# Patient Record
Sex: Male | Born: 1965 | Race: Black or African American | Hispanic: No | Smoking: Current every day smoker
Health system: Southern US, Community
[De-identification: ages and names within clinical notes are randomized; demographics above are authoritative.]

## PROBLEM LIST (undated history)

## (undated) DIAGNOSIS — I2510 Atherosclerotic heart disease of native coronary artery without angina pectoris: Secondary | ICD-10-CM

## (undated) DIAGNOSIS — E1110 Type 2 diabetes mellitus with ketoacidosis without coma: Secondary | ICD-10-CM

## (undated) DIAGNOSIS — N529 Male erectile dysfunction, unspecified: Secondary | ICD-10-CM

## (undated) DIAGNOSIS — I2109 ST elevation (STEMI) myocardial infarction involving other coronary artery of anterior wall: Secondary | ICD-10-CM

## (undated) DIAGNOSIS — E119 Type 2 diabetes mellitus without complications: Secondary | ICD-10-CM

## (undated) DIAGNOSIS — G8929 Other chronic pain: Secondary | ICD-10-CM

## (undated) DIAGNOSIS — K219 Gastro-esophageal reflux disease without esophagitis: Secondary | ICD-10-CM

## (undated) DIAGNOSIS — E785 Hyperlipidemia, unspecified: Secondary | ICD-10-CM

## (undated) HISTORY — DX: Other chronic pain: G89.29

## (undated) HISTORY — DX: Male erectile dysfunction, unspecified: N52.9

## (undated) HISTORY — DX: Atherosclerotic heart disease of native coronary artery without angina pectoris: I25.10

## (undated) HISTORY — DX: ST elevation (STEMI) myocardial infarction involving other coronary artery of anterior wall: I21.09

## (undated) HISTORY — PX: CORONARY ANGIOPLASTY WITH STENT PLACEMENT: SHX49

## (undated) HISTORY — DX: Hyperlipidemia, unspecified: E78.5

## (undated) HISTORY — DX: Type 2 diabetes mellitus with ketoacidosis without coma: E11.10

## (undated) HISTORY — PX: COLONOSCOPY: SHX174

## (undated) HISTORY — DX: Gastro-esophageal reflux disease without esophagitis: K21.9

## (undated) HISTORY — DX: Type 2 diabetes mellitus without complications: E11.9

## (undated) HISTORY — PX: SHOULDER SURGERY: SHX246

## (undated) HISTORY — PX: LUMBAR FUSION: SHX111

## (undated) HISTORY — PX: HIP SURGERY: SHX245

---

## 2015-04-06 ENCOUNTER — Other Ambulatory Visit (HOSPITAL_COMMUNITY): Payer: Self-pay | Admitting: Orthopedic Surgery

## 2015-04-06 DIAGNOSIS — M545 Low back pain: Secondary | ICD-10-CM

## 2015-04-06 DIAGNOSIS — Z981 Arthrodesis status: Secondary | ICD-10-CM

## 2015-04-22 ENCOUNTER — Ambulatory Visit (HOSPITAL_COMMUNITY)
Admission: RE | Admit: 2015-04-22 | Discharge: 2015-04-22 | Disposition: A | Discharge status: Home / Self Care | Payer: Medicare Other | Source: Ambulatory Visit | Attending: Orthopedic Surgery | Admitting: Orthopedic Surgery

## 2015-04-22 ENCOUNTER — Other Ambulatory Visit (HOSPITAL_COMMUNITY): Payer: Self-pay | Admitting: Orthopedic Surgery

## 2015-04-22 ENCOUNTER — Encounter (HOSPITAL_COMMUNITY): Payer: Self-pay | Admitting: Radiology

## 2015-04-22 DIAGNOSIS — N289 Disorder of kidney and ureter, unspecified: Secondary | ICD-10-CM | POA: Insufficient documentation

## 2015-04-22 DIAGNOSIS — M4806 Spinal stenosis, lumbar region: Secondary | ICD-10-CM | POA: Diagnosis not present

## 2015-04-22 DIAGNOSIS — I700 Atherosclerosis of aorta: Secondary | ICD-10-CM | POA: Diagnosis not present

## 2015-04-22 DIAGNOSIS — M545 Low back pain: Secondary | ICD-10-CM

## 2015-04-22 DIAGNOSIS — Z981 Arthrodesis status: Secondary | ICD-10-CM

## 2015-05-14 ENCOUNTER — Encounter (HOSPITAL_COMMUNITY): Payer: Self-pay | Admitting: Emergency Medicine

## 2015-05-14 ENCOUNTER — Emergency Department (HOSPITAL_COMMUNITY)
Admission: EM | Admit: 2015-05-14 | Discharge: 2015-05-14 | Disposition: A | Discharge status: Home / Self Care | Payer: Medicare Other | Attending: Emergency Medicine | Admitting: Emergency Medicine

## 2015-05-14 DIAGNOSIS — F17200 Nicotine dependence, unspecified, uncomplicated: Secondary | ICD-10-CM | POA: Diagnosis not present

## 2015-05-14 DIAGNOSIS — G8929 Other chronic pain: Secondary | ICD-10-CM | POA: Diagnosis not present

## 2015-05-14 DIAGNOSIS — M545 Low back pain: Secondary | ICD-10-CM | POA: Insufficient documentation

## 2015-05-14 NOTE — ED Notes (Signed)
Pt sts lower back pain chronic in nature increased x 4 days; pt sts supposed to have sx at some point; pt denies new injury

## 2018-08-16 ENCOUNTER — Other Ambulatory Visit: Payer: Self-pay | Admitting: Cardiology

## 2018-08-16 ENCOUNTER — Ambulatory Visit (INDEPENDENT_AMBULATORY_CARE_PROVIDER_SITE_OTHER): Payer: Medicare HMO | Admitting: Cardiology

## 2018-08-16 ENCOUNTER — Encounter: Payer: Self-pay | Admitting: Cardiology

## 2018-08-16 ENCOUNTER — Other Ambulatory Visit: Payer: Self-pay

## 2018-08-16 VITALS — BP 143/82 | HR 77 | Temp 96.8°F | Ht 69.0 in | Wt 166.0 lb

## 2018-08-16 DIAGNOSIS — I25118 Atherosclerotic heart disease of native coronary artery with other forms of angina pectoris: Secondary | ICD-10-CM

## 2018-08-16 DIAGNOSIS — IMO0002 Reserved for concepts with insufficient information to code with codable children: Secondary | ICD-10-CM

## 2018-08-16 DIAGNOSIS — I10 Essential (primary) hypertension: Secondary | ICD-10-CM

## 2018-08-16 DIAGNOSIS — I2510 Atherosclerotic heart disease of native coronary artery without angina pectoris: Secondary | ICD-10-CM | POA: Insufficient documentation

## 2018-08-16 DIAGNOSIS — I2109 ST elevation (STEMI) myocardial infarction involving other coronary artery of anterior wall: Secondary | ICD-10-CM | POA: Insufficient documentation

## 2018-08-16 DIAGNOSIS — N529 Male erectile dysfunction, unspecified: Secondary | ICD-10-CM | POA: Insufficient documentation

## 2018-08-16 DIAGNOSIS — E1165 Type 2 diabetes mellitus with hyperglycemia: Secondary | ICD-10-CM

## 2018-08-16 DIAGNOSIS — Z794 Long term (current) use of insulin: Secondary | ICD-10-CM | POA: Diagnosis not present

## 2018-08-16 DIAGNOSIS — F17200 Nicotine dependence, unspecified, uncomplicated: Secondary | ICD-10-CM | POA: Diagnosis not present

## 2018-08-16 DIAGNOSIS — E1110 Type 2 diabetes mellitus with ketoacidosis without coma: Secondary | ICD-10-CM | POA: Insufficient documentation

## 2018-08-16 DIAGNOSIS — K219 Gastro-esophageal reflux disease without esophagitis: Secondary | ICD-10-CM | POA: Insufficient documentation

## 2018-08-16 DIAGNOSIS — R011 Cardiac murmur, unspecified: Secondary | ICD-10-CM | POA: Diagnosis not present

## 2018-08-16 MED ORDER — AMLODIPINE BESYLATE 5 MG PO TABS: 5.0000 mg | ORAL_TABLET | Freq: Every day | ORAL | 1 refills | Status: DC

## 2018-08-16 MED ORDER — NICOTINE 21 MG/24HR TD PT24: 21.0000 mg | MEDICATED_PATCH | Freq: Every day | TRANSDERMAL | 0 refills | Status: DC

## 2018-08-16 MED ORDER — NITROGLYCERIN 0.4 MG SL SUBL: 0.4000 mg | SUBLINGUAL_TABLET | SUBLINGUAL | 3 refills | Status: AC | PRN

## 2018-08-16 MED ORDER — NICOTINE 14 MG/24HR TD PT24: 14.0000 mg | MEDICATED_PATCH | Freq: Every day | TRANSDERMAL | 0 refills | Status: DC

## 2018-08-16 NOTE — Telephone Encounter (Signed)
Please fill

## 2018-08-16 NOTE — Progress Notes (Signed)
Primary Physician:  Everardo Beals, NP   Patient ID: Javier Brown, male    DOB: 1966/03/04, 53 y.o.   MRN: [id number]  Subjective:    Chief Complaint  Patient presents with  . Coronary Artery Disease    chest pain on and off, needs nitro refilled  . New Patient (Initial Visit)    HPI: Javier Brown  is a 53 y.o. male  with history of CAD s/p stent to LAD in 2004 and again in 2010, hypertension, hyperlipidemia, GERD, tobacco use, and type 2 diabetes referred to [place] by Jinny Sanders, NP to establish care.   Patient continues to be on Prasugrel. He was previously followed by Cardiology in [place], [place]; however, has not seen them for the last several years. He moved back to [place] 1 year ago.  Prior to both MI's, he had indigestion symptoms. He reports that for the last few months he has had exertional heartburn/ burning sensation in his chest that resolves with resting. Does not occur every time with exertion. He denies any shortness of breath, leg edema, palpitations, leg swelling, PND, orthopnea, or symptoms suggestive of claudication or TIA.    States that diabetes is uncontrolled, unsure of recent HgbA1c. Reports that hypertension and hyperlipidemia are well controlled. States that he is fairly active. He does not work due to disability from chronic back pain.  He does smoke 1 pack per day. Occasional alcohol use. No drug use.   Past Medical History:  Diagnosis Date  . Acute anterior wall MI ([place])   . CAD (coronary artery disease)   . Chronic back pain   . Diabetes mellitus without complication ([place])   . DKA (diabetic ketoacidoses) ([place])   . ED (erectile dysfunction)   . GERD (gastroesophageal reflux disease)   . Hyperlipidemia     Past Surgical History:  Procedure Laterality Date  . CORONARY ANGIOPLASTY WITH STENT PLACEMENT    . HIP SURGERY    . LUMBAR FUSION    . SHOULDER SURGERY      Social History   Socioeconomic History  . Marital status: Divorced   Spouse name: Not on file  . Number of children: 1  . Years of education: Not on file  . Highest education level: Not on file  Occupational History  . Not on file  Social Needs  . Financial resource strain: Not on file  . Food insecurity:    Worry: Not on file    Inability: Not on file  . Transportation needs:    Medical: Not on file    Non-medical: Not on file  Tobacco Use  . Smoking status: Current Every Day Smoker    Packs/day: 1.00    Types: Cigarettes  . Smokeless tobacco: Never Used  Substance and Sexual Activity  . Alcohol use: Yes    Alcohol/week: 4.0 standard drinks    Types: 4 Cans of beer per week    Comment: per week  . Drug use: No  . Sexual activity: Not on file  Lifestyle  . Physical activity:    Days per week: Not on file    Minutes per session: Not on file  . Stress: Not on file  Relationships  . Social connections:    Talks on phone: Not on file    Gets together: Not on file    Attends religious service: Not on file    Active member of club or organization: Not on file    Attends meetings of clubs or organizations: Not  on file    Relationship status: Not on file  . Intimate partner violence:    Fear of current or ex partner: Not on file    Emotionally abused: Not on file    Physically abused: Not on file    Forced sexual activity: Not on file  Other Topics Concern  . Not on file  Social History Narrative  . Not on file    Review of Systems  Constitution: Negative for decreased appetite, malaise/fatigue, weight gain and weight loss.  Eyes: Negative for visual disturbance.  Cardiovascular: Negative for chest pain, claudication, dyspnea on exertion, leg swelling, orthopnea, palpitations and syncope.  Respiratory: Negative for hemoptysis and wheezing.   Endocrine: Negative for cold intolerance and heat intolerance.  Hematologic/Lymphatic: Does not bruise/bleed easily.  Skin: Negative for nail changes.  Musculoskeletal: Positive for back pain.  Negative for muscle weakness and myalgias.  Gastrointestinal: Positive for heartburn (exertional). Negative for abdominal pain, change in bowel habit, nausea and vomiting.  Neurological: Negative for difficulty with concentration, dizziness, focal weakness and headaches.  Psychiatric/Behavioral: Negative for altered mental status and suicidal ideas.  All other systems reviewed and are negative.     Objective:  Blood pressure (!) 143/82, pulse 77, temperature (!) 96.8 F (36 C), height 5' 9"  (1.753 m), weight 166 lb (75.3 kg), SpO2 97 %. Body mass index is 24.51 kg/m.    Physical Exam  Constitutional: He is oriented to person, place, and time. Vital signs are normal. He appears well-developed and well-nourished.  HENT:  Head: Normocephalic and atraumatic.  Neck: Normal range of motion.  Cardiovascular: Normal rate, regular rhythm, normal heart sounds and intact distal pulses.  Ejection click  Pulmonary/Chest: Effort normal and breath sounds normal. No accessory muscle usage. No respiratory distress.  Abdominal: Soft. Bowel sounds are normal.  Musculoskeletal: Normal range of motion.  Neurological: He is alert and oriented to person, place, and time.  Skin: Skin is warm and dry.  Vitals reviewed.  Radiology: No results found.  Laboratory examination:   PCP 07/06/2017: Na 132, K 4.4, Creatinine 0.82, Glucose 147, eGFR >90, CMP otherwise normal. RBC 4.4, CBC otherwise normal.  No flowsheet data found. No flowsheet data found. Lipid Panel  No results found for: CHOL, TRIG, HDL, CHOLHDL, VLDL, LDLCALC, LDLDIRECT HEMOGLOBIN A1C No results found for: HGBA1C, MPG TSH No results for input(s): TSH in the last 8760 hours.  PRN Meds:. There are no discontinued medications. Current Meds  Medication Sig  . aspirin EC 81 MG tablet Take 1 tablet by mouth daily.  . insulin aspart (NOVOLOG FLEXPEN) 100 UNIT/ML FlexPen Inject 8 Units as directed 3 (three) times daily.  Marland Kitchen lisinopril  (ZESTRIL) 10 MG tablet Take 1 tablet by mouth daily.  . metoprolol tartrate (LOPRESSOR) 25 MG tablet Take 1 tablet by mouth 2 (two) times a day.  Marland Kitchen omeprazole (PRILOSEC) 20 MG capsule Take 20 mg by mouth daily.  . prasugrel (EFFIENT) 10 MG TABS tablet Take 10 mg by mouth daily.  . rosuvastatin (CRESTOR) 10 MG tablet Take 1 tablet by mouth daily.    Cardiac Studies:   Exercise sestamibi stress test 02/19/2009: 1. No convincing scintigraphic evidence of ischemia or transmural  myocardial infarction.  Stress EKG portion not dictated. 2. Abnormal resting left ventricular wall motion showing generalized  hypokinesis and ejection fraction of 37 %. (Normal is 50% and greater).  Assessment:   Coronary artery disease of native artery of native heart with stable angina pectoris ([place]) - Plan: EKG  12-Lead, PCV ECHOCARDIOGRAM COMPLETE, PCV MYOCARDIAL PERFUSION WO LEXISCAN  Essential hypertension  Uncontrolled type 2 diabetes mellitus, with long-term current use of insulin (H[facility])  Tobacco use disorder  Systolic click  EKG 89/37/3428: Normal sinus rhythm at 69 bpm, normal axis, anterior infarct old. T wave inversion in inferolateral leads, cannot exclude ischemia.   Recommendations:   Patient with CAD with history of anterior wall MI in 2004 and 2010, hypertension, hyperlipidemia, type 2 diabetes, tobacco use disorder, referred to [place] to establish care.  Patients symptoms of exertional indigestion are suggestive of angina. I have sent him in a prescription of nitroglycerin. Blood pressure is also elevated, will start amlodipine both for hypertension and angina. Will schedule for exercise nuclear stress testing in the next few weeks for further evaluation. Continues to be on Effient and ASA, will continue for now. No clinical evidence of heart failure. On physical exam, he does have systolic click present. Will obtain echocardiogram for further evaluation.   Patient is on Crestor for hyperlipidemia,  will continue the same. I have stressed the importance of smoking cessation. Patient is willing to quit smoking. We have discussed treatment options, he wishes to try nicotine patches for now. Will send in the nicotine patches step packet with 21 mg for 4 weeks and 14 mg for the next 4 weeks. Will plan to send in 7 mg after completion of 14 mg packet at his next office visit. Encouraged him to contact us sooner if needed. We will see him back after the test for further recommendations.   *I have discussed this case with Dr. Einar Gip and he personally examined the patient and participated in formulating the plan.*   Miquel Dunn, MSN, APRN, FNP-C [facility]. [place] Office: [phone] Fax: [phone]

## 2018-08-18 ENCOUNTER — Other Ambulatory Visit: Payer: Self-pay | Admitting: Cardiology

## 2018-08-19 NOTE — Telephone Encounter (Signed)
Please fill

## 2018-09-09 ENCOUNTER — Other Ambulatory Visit: Payer: Self-pay

## 2018-09-09 ENCOUNTER — Other Ambulatory Visit: Payer: Medicare HMO

## 2018-09-09 ENCOUNTER — Ambulatory Visit (INDEPENDENT_AMBULATORY_CARE_PROVIDER_SITE_OTHER): Payer: Medicare HMO

## 2018-09-09 DIAGNOSIS — I25118 Atherosclerotic heart disease of native coronary artery with other forms of angina pectoris: Secondary | ICD-10-CM | POA: Diagnosis not present

## 2018-09-10 NOTE — Progress Notes (Signed)
Pt aware.

## 2018-09-10 NOTE — Progress Notes (Signed)
Let patient know that stress test was considered low risk study, we will further discuss at his next follow up. If he is still having symptoms, we may need to further evaluate.

## 2018-09-18 ENCOUNTER — Other Ambulatory Visit: Payer: Self-pay

## 2018-09-18 ENCOUNTER — Ambulatory Visit (INDEPENDENT_AMBULATORY_CARE_PROVIDER_SITE_OTHER): Payer: Medicare HMO

## 2018-09-18 DIAGNOSIS — I25118 Atherosclerotic heart disease of native coronary artery with other forms of angina pectoris: Secondary | ICD-10-CM | POA: Diagnosis not present

## 2018-09-27 ENCOUNTER — Encounter: Payer: Self-pay | Admitting: Cardiology

## 2018-09-27 ENCOUNTER — Ambulatory Visit (INDEPENDENT_AMBULATORY_CARE_PROVIDER_SITE_OTHER): Payer: Medicare HMO | Admitting: Cardiology

## 2018-09-27 ENCOUNTER — Other Ambulatory Visit: Payer: Self-pay

## 2018-09-27 VITALS — Ht 69.0 in | Wt 168.0 lb

## 2018-09-27 DIAGNOSIS — F17200 Nicotine dependence, unspecified, uncomplicated: Secondary | ICD-10-CM

## 2018-09-27 DIAGNOSIS — Z794 Long term (current) use of insulin: Secondary | ICD-10-CM | POA: Diagnosis not present

## 2018-09-27 DIAGNOSIS — E1165 Type 2 diabetes mellitus with hyperglycemia: Secondary | ICD-10-CM

## 2018-09-27 DIAGNOSIS — I10 Essential (primary) hypertension: Secondary | ICD-10-CM

## 2018-09-27 DIAGNOSIS — I2510 Atherosclerotic heart disease of native coronary artery without angina pectoris: Secondary | ICD-10-CM | POA: Diagnosis not present

## 2018-09-27 DIAGNOSIS — IMO0002 Reserved for concepts with insufficient information to code with codable children: Secondary | ICD-10-CM

## 2018-09-27 NOTE — Progress Notes (Signed)
Primary Physician:  Everardo Beals, NP   Patient ID: Javier Brown, male    DOB: September 05, 1965, 53 y.o.   MRN: [id number]  Subjective:    Chief Complaint  Patient presents with  . Coronary Artery Disease  . Follow-up   This visit type was conducted due to national recommendations for restrictions regarding the COVID-19 Pandemic (e.g. social distancing).  This format is felt to be most appropriate for this patient at this time.  All issues noted in this document were discussed and addressed.  No physical exam was performed (except for noted visual exam findings with Telehealth visits).  The patient has consented to conduct a Telehealth visit and understands insurance will be billed.   I discussed the limitations of evaluation and management by telemedicine and the availability of in person appointments. The patient expressed understanding and agreed to proceed.  Virtual Visit via Video Note is as below  I connected with Javier Brown, on 09/27/18 at 1015 by telephone and verified that I am speaking with the correct person using two identifiers. Unable to perform video visit as patient did not have equipment.    I have discussed with the patient regarding the safety during COVID Pandemic and steps and precautions including social distancing with the patient.    HPI: Javier Brown  is a 53 y.o. male  with history of CAD s/p stent to LAD in 2004 and again in 2010, hypertension, hyperlipidemia, GERD, tobacco use, and type 2 diabetes, recently evaluated by [place] for symptoms of angina in the form of indegestion.  He underwent nuclear stress test and echocardiogram and now presents for follow up. Started on amlodipine at his last visit.   Patient continues to be on Prasugrel. He was previously followed by Cardiology in [place], [place].   Prior to both MI's, he had indigestion symptoms and over the last few months had had exertional indigestion that improved with resting. Since being on amlodipine, he  states that he has not had any further episodes. Feeling well.   He denies any shortness of breath, leg edema, palpitations, leg swelling, PND, orthopnea, or symptoms suggestive of claudication or TIA.    States that diabetes is better controlled, he is to have follow up and labs with PCP in the next few weeks. Reports that hypertension and hyperlipidemia are well controlled. States that he is fairly active. He does not work due to disability from chronic back pain.  He has now cut back to 1/2 pack per day with nicotine patches. Occasional alcohol use. No drug use.   Past Medical History:  Diagnosis Date  . Acute anterior wall MI ([place])   . CAD (coronary artery disease)   . Chronic back pain   . Diabetes mellitus without complication ([place])   . DKA (diabetic ketoacidoses) ([place])   . ED (erectile dysfunction)   . GERD (gastroesophageal reflux disease)   . Hyperlipidemia     Past Surgical History:  Procedure Laterality Date  . CORONARY ANGIOPLASTY WITH STENT PLACEMENT    . HIP SURGERY    . LUMBAR FUSION    . SHOULDER SURGERY      Social History   Socioeconomic History  . Marital status: Divorced    Spouse name: Not on file  . Number of children: 1  . Years of education: Not on file  . Highest education level: Not on file  Occupational History  . Not on file  Social Needs  . Financial resource strain: Not on file  .  Food insecurity    Worry: Not on file    Inability: Not on file  . Transportation needs    Medical: Not on file    Non-medical: Not on file  Tobacco Use  . Smoking status: Current Every Day Smoker    Packs/day: 1.00    Types: Cigarettes  . Smokeless tobacco: Never Used  Substance and Sexual Activity  . Alcohol use: Yes    Alcohol/week: 4.0 standard drinks    Types: 4 Cans of beer per week    Comment: per week  . Drug use: No  . Sexual activity: Not on file  Lifestyle  . Physical activity    Days per week: Not on file    Minutes per session: Not on  file  . Stress: Not on file  Relationships  . Social Herbalist on phone: Not on file    Gets together: Not on file    Attends religious service: Not on file    Active member of club or organization: Not on file    Attends meetings of clubs or organizations: Not on file    Relationship status: Not on file  . Intimate partner violence    Fear of current or ex partner: Not on file    Emotionally abused: Not on file    Physically abused: Not on file    Forced sexual activity: Not on file  Other Topics Concern  . Not on file  Social History Narrative  . Not on file    Review of Systems  Constitution: Negative for decreased appetite, malaise/fatigue, weight gain and weight loss.  Eyes: Negative for visual disturbance.  Cardiovascular: Negative for chest pain, claudication, dyspnea on exertion, leg swelling, orthopnea, palpitations and syncope.  Respiratory: Negative for hemoptysis and wheezing.   Endocrine: Negative for cold intolerance and heat intolerance.  Hematologic/Lymphatic: Does not bruise/bleed easily.  Skin: Negative for nail changes.  Musculoskeletal: Positive for back pain. Negative for muscle weakness and myalgias.  Gastrointestinal: Positive for heartburn (exertional). Negative for abdominal pain, change in bowel habit, nausea and vomiting.  Neurological: Negative for difficulty with concentration, dizziness, focal weakness and headaches.  Psychiatric/Behavioral: Negative for altered mental status and suicidal ideas.  All other systems reviewed and are negative.     Objective:  Height 5' 9"  (1.753 m), weight 168 lb (76.2 kg). Body mass index is 24.81 kg/m.   Physical exam not performed or limited due to virtual visit.    Please see exam details from prior visit is as below.    Physical Exam  Constitutional: He is oriented to person, place, and time. Vital signs are normal. He appears well-developed and well-nourished.  HENT:  Head: Normocephalic and  atraumatic.  Neck: Normal range of motion.  Cardiovascular: Normal rate, regular rhythm, normal heart sounds and intact distal pulses.  Ejection click  Pulmonary/Chest: Effort normal and breath sounds normal. No accessory muscle usage. No respiratory distress.  Abdominal: Soft. Bowel sounds are normal.  Musculoskeletal: Normal range of motion.  Neurological: He is alert and oriented to person, place, and time.  Skin: Skin is warm and dry.  Vitals reviewed.  Radiology: No results found.  Laboratory examination:   PCP 07/06/2017: Na 132, K 4.4, Creatinine 0.82, Glucose 147, eGFR >90, CMP otherwise normal. RBC 4.4, CBC otherwise normal.  No flowsheet data found. No flowsheet data found. Lipid Panel  No results found for: CHOL, TRIG, HDL, CHOLHDL, VLDL, LDLCALC, LDLDIRECT HEMOGLOBIN A1C No results found for: HGBA1C,  MPG TSH No results for input(s): TSH in the last 8760 hours.  PRN Meds:. Medications Discontinued During This Encounter  Medication Reason  . nicotine (NICODERM CQ - DOSED IN MG/24 HOURS) 21 mg/24hr patch Error   Current Meds  Medication Sig  . amLODipine (NORVASC) 5 MG tablet TAKE 1 TABLET(5 MG) BY MOUTH DAILY  . aspirin EC 81 MG tablet Take 1 tablet by mouth daily.  . insulin aspart (NOVOLOG FLEXPEN) 100 UNIT/ML FlexPen Inject 8 Units as directed 3 (three) times daily.  Marland Kitchen lisinopril (ZESTRIL) 10 MG tablet Take 1 tablet by mouth daily.  . metoprolol tartrate (LOPRESSOR) 25 MG tablet Take 1 tablet by mouth 2 (two) times a day.  . nicotine (NICODERM CQ - DOSED IN MG/24 HOURS) 14 mg/24hr patch Place 1 patch (14 mg total) onto the skin daily.  . nitroGLYCERIN (NITROSTAT) 0.4 MG SL tablet Place 1 tablet (0.4 mg total) under the tongue every 5 (five) minutes as needed for chest pain.  Marland Kitchen omeprazole (PRILOSEC) 20 MG capsule Take 20 mg by mouth daily.  . prasugrel (EFFIENT) 10 MG TABS tablet Take 10 mg by mouth daily.  . rosuvastatin (CRESTOR) 10 MG tablet Take 1 tablet by  mouth daily.    Cardiac Studies:   Echocardiogram 09/18/2018: Normal LV systolic function with EF 50-55%. Left ventricle cavity is normal in size. Mild concentric hypertrophy of the left ventricle. Normal global wall motion. Normal diastolic filling pattern.  No significant valvular abnormality. Normal right atrial pressure.   Lexiscan Myoview Stress Test 09/09/2018: Resting EKG demonstrated normal sinus rhythm.  Nonspecific T-wave abnormality.  Stress EKG is nondiagnostic. Perfusion imaging study demonstrates mild apical thinning but no demonstrable ischemia or scar.  LV systolic function was 16% without regional wall motion abnormality. This is a low risk study.  Assessment:     ICD-10-CM   1. Coronary artery disease involving native coronary artery of native heart without angina pectoris  I25.10   2. Essential hypertension  I10   3. Uncontrolled type 2 diabetes mellitus, with long-term current use of insulin (HCC)  E11.65    Z79.4   4. Tobacco use disorder  F17.200      EKG 08/16/2018: Normal sinus rhythm at 69 bpm, normal axis, anterior infarct old. T wave inversion in inferolateral leads, cannot exclude ischemia  Recommendations:   Patient has had improvement in angina symptoms with addition of amlodipine.  I discussed recently obtained nuclear stress test with the patient, considered low risk study.  I suspect his angina was related to small vessel disease, would recommend continued aggressive medical management unless he again has worsening symptoms.  I will continue with Effient and aspirin therapy for now, but may consider discontinuing Effient at his next office visit.    I have also discussed recently obtain echocardiogram, normal LVEF.  No valvular abnormalities.  He will continue to work with his PCP for management of diabetes that he states is much better controlled.  He will have lipids reevaluated soon with PCP as well.  He did not have a way to check his blood  pressure today, will have this also evaluated at PCP.    I have congratulated him on being able to cut back on tobacco use.  Encouraged him to continue to work on this with nicotine patches and stress importance of completely quitting smoking.  I'll see him back in 3 months for follow-up.  Miquel Dunn, MSN, APRN, FNP-C [facility]. [place] Office: [phone] Fax: [phone]

## 2018-12-02 ENCOUNTER — Other Ambulatory Visit: Payer: Self-pay | Admitting: Cardiology

## 2019-01-01 ENCOUNTER — Ambulatory Visit: Payer: Medicare HMO | Admitting: Cardiology

## 2019-01-09 ENCOUNTER — Encounter: Payer: Self-pay | Admitting: Cardiology

## 2019-01-09 ENCOUNTER — Other Ambulatory Visit: Payer: Self-pay

## 2019-01-09 ENCOUNTER — Ambulatory Visit (INDEPENDENT_AMBULATORY_CARE_PROVIDER_SITE_OTHER): Payer: Medicare HMO | Admitting: Cardiology

## 2019-01-09 VITALS — BP 142/83 | HR 79 | Temp 96.5°F | Ht 69.0 in | Wt 168.0 lb

## 2019-01-09 DIAGNOSIS — Z794 Long term (current) use of insulin: Secondary | ICD-10-CM | POA: Diagnosis not present

## 2019-01-09 DIAGNOSIS — E1165 Type 2 diabetes mellitus with hyperglycemia: Secondary | ICD-10-CM | POA: Diagnosis not present

## 2019-01-09 DIAGNOSIS — I10 Essential (primary) hypertension: Secondary | ICD-10-CM | POA: Diagnosis not present

## 2019-01-09 DIAGNOSIS — IMO0002 Reserved for concepts with insufficient information to code with codable children: Secondary | ICD-10-CM

## 2019-01-09 DIAGNOSIS — N529 Male erectile dysfunction, unspecified: Secondary | ICD-10-CM | POA: Diagnosis not present

## 2019-01-09 DIAGNOSIS — I2510 Atherosclerotic heart disease of native coronary artery without angina pectoris: Secondary | ICD-10-CM | POA: Diagnosis not present

## 2019-01-09 DIAGNOSIS — F17200 Nicotine dependence, unspecified, uncomplicated: Secondary | ICD-10-CM

## 2019-01-09 MED ORDER — SILDENAFIL CITRATE 25 MG PO TABS: 25.0000 mg | ORAL_TABLET | Freq: Every day | ORAL | 1 refills | Status: DC | PRN

## 2019-01-09 NOTE — Progress Notes (Signed)
Primary Physician:  Everardo Beals, NP   Patient ID: Javier Brown, male    DOB: 12/15/1965, 53 y.o.   MRN: [id number]  Subjective:    Chief Complaint  Patient presents with  . Coronary Artery Disease  . Chest Pain  . Hypertension  . Follow-up    3 month    HPI: Javier Brown  is a 53 y.o. male  with history of CAD s/p stent to LAD in 2004 and again in 2010, hypertension, hyperlipidemia, GERD, tobacco use, and type 2 diabetes, recently evaluated by [place] for symptoms of angina in the form of indegestion.  He underwent nuclear stress test Lexiscan nuclear stress test on 09/09/2018 that was without perfusion abnormalities and was considered low risk study.  Echocardiogram also performed at that time revealed normal LVEF.  Patient had improvement with indigestion symptoms that were thought to be angina equivalent with addition of amlodipine.  Patient continues to be on Prasugrel. He was previously followed by Cardiology in [place], [place].   He denies any shortness of breath, leg edema, palpitations, leg swelling, PND, orthopnea, or symptoms suggestive of claudication or TIA. He is walking 4 times a day and tolerates this well without any chest pain.   States that diabetes is better controlled, he states he needs to follow up with his PCP. Reports that hypertension and hyperlipidemia are well controlled. States that he is fairly active. He does not work due to disability from chronic back pain.  He has now cut back to less than 1/2 pack a day as he is slowing weaning himself off. Occasional alcohol use. No drug use.   Past Medical History:  Diagnosis Date  . Acute anterior wall MI ([place])   . CAD (coronary artery disease)   . Chronic back pain   . Diabetes mellitus without complication ([place])   . DKA (diabetic ketoacidoses) ([place])   . ED (erectile dysfunction)   . GERD (gastroesophageal reflux disease)   . Hyperlipidemia     Past Surgical History:  Procedure Laterality Date  .  CORONARY ANGIOPLASTY WITH STENT PLACEMENT    . HIP SURGERY    . LUMBAR FUSION    . SHOULDER SURGERY      Social History   Socioeconomic History  . Marital status: Divorced    Spouse name: Not on file  . Number of children: 1  . Years of education: Not on file  . Highest education level: Not on file  Occupational History  . Not on file  Social Needs  . Financial resource strain: Not on file  . Food insecurity    Worry: Not on file    Inability: Not on file  . Transportation needs    Medical: Not on file    Non-medical: Not on file  Tobacco Use  . Smoking status: Current Every Day Smoker    Packs/day: 0.50    Types: Cigarettes  . Smokeless tobacco: Never Used  Substance and Sexual Activity  . Alcohol use: Yes    Alcohol/week: 4.0 standard drinks    Types: 4 Cans of beer per week    Comment: per week  . Drug use: No  . Sexual activity: Not on file  Lifestyle  . Physical activity    Days per week: Not on file    Minutes per session: Not on file  . Stress: Not on file  Relationships  . Social Herbalist on phone: Not on file    Gets together: Not on  file    Attends religious service: Not on file    Active member of club or organization: Not on file    Attends meetings of clubs or organizations: Not on file    Relationship status: Not on file  . Intimate partner violence    Fear of current or ex partner: Not on file    Emotionally abused: Not on file    Physically abused: Not on file    Forced sexual activity: Not on file  Other Topics Concern  . Not on file  Social History Narrative  . Not on file    Review of Systems  Constitution: Negative for decreased appetite, malaise/fatigue, weight gain and weight loss.  Eyes: Negative for visual disturbance.  Cardiovascular: Negative for chest pain, claudication, dyspnea on exertion, leg swelling, orthopnea, palpitations and syncope.  Respiratory: Negative for hemoptysis and wheezing.   Endocrine: Negative  for cold intolerance and heat intolerance.  Hematologic/Lymphatic: Does not bruise/bleed easily.  Skin: Negative for nail changes.  Musculoskeletal: Positive for back pain. Negative for muscle weakness and myalgias.  Gastrointestinal: Positive for heartburn (exertional). Negative for abdominal pain, change in bowel habit, nausea and vomiting.  Neurological: Negative for difficulty with concentration, dizziness, focal weakness and headaches.  Psychiatric/Behavioral: Negative for altered mental status and suicidal ideas.  All other systems reviewed and are negative.     Objective:  Blood pressure (!) 142/83, pulse 79, temperature (!) 96.5 F (35.8 C), height 5' 9"  (1.753 m), weight 168 lb (76.2 kg), SpO2 98 %. Body mass index is 24.81 kg/m.     Physical Exam  Constitutional: He is oriented to person, place, and time. Vital signs are normal. He appears well-developed and well-nourished.  HENT:  Head: Normocephalic and atraumatic.  Neck: Normal range of motion.  Cardiovascular: Normal rate, regular rhythm, normal heart sounds and intact distal pulses.  Ejection click  Pulmonary/Chest: Effort normal and breath sounds normal. No accessory muscle usage. No respiratory distress.  Abdominal: Soft. Bowel sounds are normal.  Musculoskeletal: Normal range of motion.  Neurological: He is alert and oriented to person, place, and time.  Skin: Skin is warm and dry.  Vitals reviewed.  Radiology: No results found.  Laboratory examination:   PCP 07/06/2017: Na 132, K 4.4, Creatinine 0.82, Glucose 147, eGFR >90, CMP otherwise normal. RBC 4.4, CBC otherwise normal.  No flowsheet data found. No flowsheet data found. Lipid Panel  No results found for: CHOL, TRIG, HDL, CHOLHDL, VLDL, LDLCALC, LDLDIRECT HEMOGLOBIN A1C No results found for: HGBA1C, MPG TSH No results for input(s): TSH in the last 8760 hours.  PRN Meds:. Medications Discontinued During This Encounter  Medication Reason  .  prasugrel (EFFIENT) 10 MG TABS tablet Completed Course   Current Meds  Medication Sig  . amLODipine (NORVASC) 5 MG tablet TAKE 1 TABLET(5 MG) BY MOUTH DAILY  . amoxicillin (AMOXIL) 500 MG tablet Prior to dental  . aspirin EC 81 MG tablet Take 1 tablet by mouth daily.  . insulin aspart (NOVOLOG FLEXPEN) 100 UNIT/ML FlexPen Inject 8 Units as directed 3 (three) times daily.  Marland Kitchen lisinopril (ZESTRIL) 10 MG tablet Take 1 tablet by mouth daily.  . metoprolol tartrate (LOPRESSOR) 25 MG tablet Take 1 tablet by mouth 2 (two) times a day.  . nicotine (NICODERM CQ - DOSED IN MG/24 HOURS) 14 mg/24hr patch Place 1 patch (14 mg total) onto the skin daily.  . nitroGLYCERIN (NITROSTAT) 0.4 MG SL tablet Place 1 tablet (0.4 mg total) under the tongue every  5 (five) minutes as needed for chest pain.  Marland Kitchen omeprazole (PRILOSEC) 20 MG capsule Take 20 mg by mouth daily.  . rosuvastatin (CRESTOR) 10 MG tablet Take 1 tablet by mouth daily.  . [DISCONTINUED] prasugrel (EFFIENT) 10 MG TABS tablet Take 10 mg by mouth daily.    Cardiac Studies:   Echocardiogram 09/18/2018: Normal LV systolic function with EF 50-55%. Left ventricle cavity is normal in size. Mild concentric hypertrophy of the left ventricle. Normal global wall motion. Normal diastolic filling pattern.  No significant valvular abnormality. Normal right atrial pressure.   Lexiscan Myoview Stress Test 09/09/2018: Resting EKG demonstrated normal sinus rhythm.  Nonspecific T-wave abnormality.  Stress EKG is nondiagnostic. Perfusion imaging study demonstrates mild apical thinning but no demonstrable ischemia or scar.  LV systolic function was 22% without regional wall motion abnormality. This is a low risk study.  Assessment:     ICD-10-CM   1. Coronary artery disease involving native coronary artery of native heart without angina pectoris  I25.10   2. Essential hypertension  I10 EKG 12-Lead     EKG 01/09/2019: Normal sinus rhythm at 78 bpm, normal  axis, anterior infarct old. T wave inversion in inferolateral leads, cannot exclude ischemia No changes compared to EKG 08/16/2018  Recommendations:   Patient is presently doing well without any today events of angina over the last several months.  He is on appropriate medical therapy.  No changes are noted to his EKG.  He has had negative nuclear stress testing in June 2020.  I will discontinue his Effient as he has been stable and last PCI was in 2010. He is requesting prescription for Viagra to help with erectile dysfunction, as he has not had any recent coronary intervention and is not on long-acting nitroglycerin, see no contraindication to this.  He is aware of risk of hypotension with blood pressure medications and is aware to closely monitor this. Also advised to not use Nitroglycerin within 48 hours of use of viagra.   Blood pressure was initially elevated in our office, but on recheck had improved.  We will continue with current medications.  I have encouraged him to follow-up with his PCP regarding diabetes management.  I congratulated him on being able to significantly cut back on tobacco use, encouraged him to continue to work on this towards complete smoking cessation.  He appears motivated.  He will see [place] back in 6 months or sooner if problems.  Patient also brought along from his dentist for upcoming dental extraction, no contraindication from cardiac standpoint.  He does not need endocarditis prophylaxis.  Miquel Dunn, MSN, APRN, FNP-C [facility]. [place] Office: [phone] Fax: [phone]

## 2019-01-10 ENCOUNTER — Encounter: Payer: Self-pay | Admitting: Cardiology

## 2019-02-12 DIAGNOSIS — K219 Gastro-esophageal reflux disease without esophagitis: Secondary | ICD-10-CM | POA: Diagnosis not present

## 2019-02-12 DIAGNOSIS — I2510 Atherosclerotic heart disease of native coronary artery without angina pectoris: Secondary | ICD-10-CM | POA: Diagnosis not present

## 2019-02-12 DIAGNOSIS — I10 Essential (primary) hypertension: Secondary | ICD-10-CM | POA: Diagnosis not present

## 2019-02-12 DIAGNOSIS — E119 Type 2 diabetes mellitus without complications: Secondary | ICD-10-CM | POA: Diagnosis not present

## 2019-06-03 ENCOUNTER — Other Ambulatory Visit: Payer: Self-pay | Admitting: Cardiology

## 2019-07-10 NOTE — Progress Notes (Deleted)
Primary Physician/Referring:  Everardo Beals, NP  Patient ID: Javier Brown, male    DOB: 11/14/1965, 54 y.o.   MRN: [id number]  No chief complaint on file.  HPI:    Javier Brown  is a 54 y.o. *** Caucasian male with history of CAD s/p stent to LAD in 2004 and again in 2010, hypertension, hyperlipidemia, GERD, tobacco use, and type 2 diabetes, recently evaluated by [place] for symptoms of angina in the form of indegestion.  He underwent nuclear stress test Lexiscan nuclear stress test on 09/09/2018 that was without perfusion abnormalities and was considered low risk study.  Echocardiogram also performed at that time revealed normal LVEF.  Patient had improvement with indigestion symptoms that were thought to be angina equivalent with addition of amlodipine.  Patient continues to be on Prasugrel. He was previously followed by Cardiology in [place], [place].   He denies any shortness of breath, leg edema, palpitations, leg swelling, PND, orthopnea, or symptoms suggestive of claudication or TIA. He is walking 4 times a day and tolerates this well without any chest pain.   States that diabetes is better controlled, he states he needs to follow up with his PCP. Reports that hypertension and hyperlipidemia are well controlled. States that he is fairly active. He does not work due to disability from chronic back pain.  He has now cut back to less than 1/2 pack a day as he is slowing weaning himself off. Occasional alcohol use. No drug use.     Past Medical History:  Diagnosis Date  . Acute anterior wall MI ([place])   . CAD (coronary artery disease)   . Chronic back pain   . Diabetes mellitus without complication ([place])   . DKA (diabetic ketoacidoses) ([place])   . ED (erectile dysfunction)   . GERD (gastroesophageal reflux disease)   . Hyperlipidemia    Past Surgical History:  Procedure Laterality Date  . CORONARY ANGIOPLASTY WITH STENT PLACEMENT    . HIP SURGERY    . LUMBAR FUSION    . SHOULDER  SURGERY     Social History   Tobacco Use  . Smoking status: Current Every Day Smoker    Packs/day: 0.50    Types: Cigarettes  . Smokeless tobacco: Never Used  Substance Use Topics  . Alcohol use: Yes    Alcohol/week: 4.0 standard drinks    Types: 4 Cans of beer per week    Comment: per week   Marital Status: Divorced  ROS  ***ROS Objective  There were no vitals taken for this visit.  Vitals with BMI 01/09/2019 01/09/2019 09/27/2018  Height - 5' 9"  5' 9"   Weight - 168 lbs 168 lbs  BMI - 75.9 16.3  Systolic 846 659 -  Diastolic 83 85 -  Pulse - 79 -     ***Physical Exam  Cardiovascular:  Ejection click.   Laboratory examination:   No results for input(s): NA, K, CL, CO2, GLUCOSE, BUN, CREATININE, CALCIUM, GFRNONAA, GFRAA in the last 8760 hours. CrCl cannot be calculated (No successful lab value found.).  No flowsheet data found. No flowsheet data found. Lipid Panel  No results found for: CHOL, TRIG, HDL, CHOLHDL, VLDL, LDLCALC, LDLDIRECT HEMOGLOBIN A1C No results found for: HGBA1C, MPG TSH No results for input(s): TSH in the last 8760 hours.  External labs ***:   PCP 07/06/2017: Na 132, K 4.4, Creatinine 0.82, Glucose 147, eGFR >90, CMP otherwise normal. RBC 4.4, CBC otherwise normal.  Medications and allergies   Allergies  Allergen  Reactions  . Coumadin [Warfarin Sodium] Hives     Current Outpatient Medications  Medication Instructions  . amLODipine (NORVASC) 5 MG tablet TAKE 1 TABLET BY MOUTH DAILY  . amoxicillin (AMOXIL) 500 MG tablet Prior to dental  . aspirin EC 81 MG tablet 1 tablet, Oral, Daily  . insulin aspart (NOVOLOG FLEXPEN) 8 Units, Injection, 3 times daily  . lisinopril (ZESTRIL) 10 MG tablet 1 tablet, Oral, Daily  . metoprolol tartrate (LOPRESSOR) 25 MG tablet 1 tablet, Oral, 2 times daily  . nicotine (NICODERM CQ - DOSED IN MG/24 HOURS) 14 mg, Transdermal, Daily  . nitroGLYCERIN (NITROSTAT) 0.4 mg, Sublingual, Every 5 min PRN  .  omeprazole (PRILOSEC) 20 mg, Oral, Daily  . rosuvastatin (CRESTOR) 10 MG tablet 1 tablet, Oral, Daily  . sildenafil (VIAGRA) 25 mg, Oral, Daily PRN    Radiology:  No results found.  Cardiac Studies:   *** Lexiscan Myoview Stress Test 09/09/2018: Resting EKG demonstrated normal sinus rhythm.  Nonspecific T-wave abnormality.  Stress EKG is nondiagnostic. Perfusion imaging study demonstrates mild apical thinning but no demonstrable ischemia or scar.  LV systolic function was 19% without regional wall motion abnormality. This is a low risk study.  Echocardiogram 09/18/2018: Normal LV systolic function with EF 50-55%. Left ventricle cavity is normal in size. Mild concentric hypertrophy of the left ventricle. Normal global wall motion. Normal diastolic filling pattern.  No significant valvular abnormality. Normal right atrial pressure.    Assessment     ICD-10-CM   1. Coronary artery disease involving native coronary artery of native heart without angina pectoris  I25.10   2. Essential hypertension  I10     ***  EKG 01/09/2019: Normal sinus rhythm at 78 bpm, normal axis, anterior infarct old. T wave inversion in inferolateral leads, cannot exclude ischemia No changes compared to EKG 08/16/2018  No orders of the defined types were placed in this encounter.   There are no discontinued medications.   Recommendations:   ***Patient is presently doing well without any today events of angina over the last several months.  He is on appropriate medical therapy.  No changes are noted to his EKG.  He has had negative nuclear stress testing in June 2020.  I will discontinue his Effient as he has been stable and last PCI was in 2010. He is requesting prescription for Viagra to help with erectile dysfunction, as he has not had any recent coronary intervention and is not on long-acting nitroglycerin, see no contraindication to this.  He is aware of risk of hypotension with blood pressure medications  and is aware to closely monitor this. Also advised to not use Nitroglycerin within 48 hours of use of viagra.   Blood pressure was initially elevated in our office, but on recheck had improved.  We will continue with current medications.  I have encouraged him to follow-up with his PCP regarding diabetes management.  I congratulated him on being able to significantly cut back on tobacco use, encouraged him to continue to work on this towards complete smoking cessation.  He appears motivated.  He will see [place] back in 6 months or sooner if problems.  Patient also brought along from his dentist for upcoming dental extraction, no contraindication from cardiac standpoint.  He does not need endocarditis prophylaxis.    Adrian Prows, MD, [facility] 07/10/2019, 12:51 PM [facility]. [place] Pager: [phone] Office: [phone] If no answer Cell [phone]

## 2019-07-11 ENCOUNTER — Ambulatory Visit: Payer: Medicare HMO | Admitting: Cardiology

## 2019-07-11 DIAGNOSIS — I2510 Atherosclerotic heart disease of native coronary artery without angina pectoris: Secondary | ICD-10-CM

## 2019-07-11 DIAGNOSIS — I10 Essential (primary) hypertension: Secondary | ICD-10-CM

## 2019-08-20 DIAGNOSIS — Z135 Encounter for screening for eye and ear disorders: Secondary | ICD-10-CM | POA: Diagnosis not present

## 2019-08-20 DIAGNOSIS — H5210 Myopia, unspecified eye: Secondary | ICD-10-CM | POA: Diagnosis not present

## 2019-08-20 DIAGNOSIS — I10 Essential (primary) hypertension: Secondary | ICD-10-CM | POA: Diagnosis not present

## 2019-08-20 DIAGNOSIS — E119 Type 2 diabetes mellitus without complications: Secondary | ICD-10-CM | POA: Diagnosis not present

## 2019-09-18 ENCOUNTER — Other Ambulatory Visit: Payer: Self-pay | Admitting: Cardiology

## 2019-10-03 ENCOUNTER — Ambulatory Visit: Payer: Medicare HMO | Admitting: Cardiology

## 2019-12-09 DIAGNOSIS — R05 Cough: Secondary | ICD-10-CM | POA: Diagnosis not present

## 2019-12-09 DIAGNOSIS — Z20822 Contact with and (suspected) exposure to covid-19: Secondary | ICD-10-CM | POA: Diagnosis not present

## 2019-12-14 ENCOUNTER — Other Ambulatory Visit: Payer: Self-pay | Admitting: Cardiology

## 2020-01-06 ENCOUNTER — Other Ambulatory Visit: Payer: Self-pay | Admitting: Cardiology

## 2020-01-08 ENCOUNTER — Other Ambulatory Visit: Payer: Self-pay

## 2020-01-08 MED ORDER — AMLODIPINE BESYLATE 5 MG PO TABS: 5.0000 mg | ORAL_TABLET | Freq: Every day | ORAL | 2 refills | Status: DC

## 2020-01-13 NOTE — Progress Notes (Signed)
Primary Physician/Referring:  Everardo Beals, NP  Patient ID: Javier Brown, male    DOB: 1965/10/03, 54 y.o.   MRN: [id number]  Chief Complaint  Patient presents with   Coronary Artery Disease   Follow-up    6 month   HPI:    Javier Brown  is a 54 y.o. male  with history of CAD s/p stent to LAD in 2004 and again in 2010, hypertension, hyperlipidemia, GERD, tobacco use, and type 2 diabetes, evaluated by [place] for symptoms of angina in the form of indegestion. He underwent nuclear stress test Lexiscan nuclear stress test on 09/09/2018 that was without perfusion abnormalities and was considered low risk study.  Echocardiogram also performed at that time revealed normal LVEF.  Patient had improvement with indigestion symptoms that were thought to be angina equivalent with addition of amlodipine.  Presents now for annual follow-up.  States he is presently doing well without symptoms.  Denies chest pain, shortness of breath, palpitations, leg edema, PND, orthopnea or symptoms suggestive of TIA/CVA.  He is presently staying active working part-time at [employer], as well as walking 1-1.5 hours 3-4 times per week and playing with his grandkids.  Unfortunately he continues to smoke approximately 1 pack/day and occasionally drinks alcohol.  He has been lost to follow-up with his PCP and has not been seen in about 2 years, she was previously managing his diabetes mellitus.  Of note patient states he drinks 3 to 4 cups of coffee each morning and soda throughout the day.  Past Medical History:  Diagnosis Date   Acute anterior wall MI ([place])    CAD (coronary artery disease)    Chronic back pain    Diabetes mellitus without complication ([place])    DKA (diabetic ketoacidoses)    ED (erectile dysfunction)    GERD (gastroesophageal reflux disease)    Hyperlipidemia    Past Surgical History:  Procedure Laterality Date   CORONARY ANGIOPLASTY WITH STENT PLACEMENT     HIP SURGERY     LUMBAR FUSION      SHOULDER SURGERY     Family History  Problem Relation Age of Onset   Heart disease Mother    Heart attack Mother    Diabetes Sister     Social History   Tobacco Use   Smoking status: Current Every Day Smoker    Packs/day: 0.50    Types: Cigarettes   Smokeless tobacco: Never Used  Substance Use Topics   Alcohol use: Yes    Alcohol/week: 4.0 standard drinks    Types: 4 Cans of beer per week    Comment: per week   Marital Status: Divorced   ROS  Review of Systems  Constitutional: Negative for malaise/fatigue and weight gain.  Cardiovascular: Negative for chest pain, claudication, leg swelling, near-syncope, orthopnea, palpitations, paroxysmal nocturnal dyspnea and syncope.  Respiratory: Negative for shortness of breath.   Hematologic/Lymphatic: Does not bruise/bleed easily.  Gastrointestinal: Negative for melena.  Neurological: Negative for dizziness and weakness.    Objective  Blood pressure (!) 144/82, pulse (!) 111, resp. rate 16, height _0  (1.753 m), weight 154 lb (69.9 kg), SpO2 96 %.  Vitals with BMI 01/14/2020 01/09/2019 01/09/2019  Height _1  - _2   Weight 154 lbs - 168 lbs  BMI 81.27 - 51.7  Systolic 001 749 449  Diastolic 82 83 85  Pulse 675 - 79      Physical Exam Vitals reviewed.  HENT:     Head: Normocephalic and atraumatic.  Cardiovascular:     Rate and Rhythm: Regular rhythm. Tachycardia present.     Pulses: Intact distal pulses.     Heart sounds: S1 normal and S2 normal. No murmur heard.  No gallop.      Comments: Ejection click.  Pulmonary:     Effort: Pulmonary effort is normal. No respiratory distress.     Breath sounds: No wheezing, rhonchi or rales.  Musculoskeletal:     Right lower leg: No edema.     Left lower leg: No edema.  Neurological:     Mental Status: He is alert.     Laboratory examination:   No results for input(s): NA, K, CL, CO2, GLUCOSE, BUN, CREATININE, CALCIUM, GFRNONAA, GFRAA in the last 8760  hours. CrCl cannot be calculated (No successful lab value found.).  No flowsheet data found. No flowsheet data found.  Lipid Panel No results for input(s): CHOL, TRIG, LDLCALC, VLDL, HDL, CHOLHDL, LDLDIRECT in the last 8760 hours.  HEMOGLOBIN A1C No results found for: HGBA1C, MPG TSH No results for input(s): TSH in the last 8760 hours.  External labs:   07/06/2017: Na 132, K 4.4, Creatinine 0.82, Glucose 147, eGFR >90, CMP otherwise normal. RBC 4.4, CBC otherwise normal.   Medications and allergies   Allergies  Allergen Reactions   Coumadin [Warfarin Sodium] Hives     Outpatient Medications Prior to Visit  Medication Sig Dispense Refill   aspirin EC 81 MG tablet Take 1 tablet by mouth daily.     insulin aspart (NOVOLOG FLEXPEN) 100 UNIT/ML FlexPen Inject 8 Units as directed 3 (three) times daily.     lisinopril (ZESTRIL) 10 MG tablet Take 1 tablet by mouth daily.     nitroGLYCERIN (NITROSTAT) 0.4 MG SL tablet Place 1 tablet (0.4 mg total) under the tongue every 5 (five) minutes as needed for chest pain. 25 tablet 3   omeprazole (PRILOSEC) 20 MG capsule Take 20 mg by mouth daily.     rosuvastatin (CRESTOR) 10 MG tablet Take 1 tablet by mouth daily.     sildenafil (VIAGRA) 25 MG tablet Take 1 tablet (25 mg total) by mouth daily as needed for erectile dysfunction (Do not use within 48 hours of nitroglycerin use). 10 tablet 1   Zinc Sulfate (ZINC 15 PO) Take by mouth.     amLODipine (NORVASC) 5 MG tablet Take 1 tablet (5 mg total) by mouth daily. 90 tablet 2   nicotine (NICODERM CQ - DOSED IN MG/24 HOURS) 14 mg/24hr patch Place 1 patch (14 mg total) onto the skin daily. (Patient not taking: Reported on 01/14/2020) 14 patch 0   amoxicillin (AMOXIL) 500 MG tablet Prior to dental     metoprolol tartrate (LOPRESSOR) 25 MG tablet Take 1 tablet by mouth 2 (two) times a day. (Patient not taking: Reported on 01/14/2020)     No facility-administered medications prior to  visit.     Radiology:   No results found.  Cardiac Studies:   Echocardiogram 09/18/2018: Normal LV systolic function with EF 50-55%. Left ventricle cavity is normal in size. Mild concentric hypertrophy of the left ventricle. Normal global wall motion. Normal diastolic filling pattern.  No significant valvular abnormality. Normal right atrial pressure.   Lexiscan Myoview Stress Test06/22/2020: Resting EKG demonstrated normal sinus rhythm. Nonspecific T-wave abnormality. Stress EKG is nondiagnostic. Perfusion imaging study demonstrates mild apical thinning but no demonstrable ischemia or scar. LV systolic function was 34% without regional wall motion abnormality. This is a low risk study.  EKG:  EKG  01/14/2020: Sinus tachycardia at a rate of 111 bpm, right atrial enlargement.  Normal axis.  Poor R wave progression, cannot exclude anterior infarct old.  Compared to EKG 01/09/2019, tachycardia, right atrial enlargement new.  EKG 01/09/2019: Normal sinus rhythm at 78 bpm, normal axis, anterior infarct old. T wave inversion in inferolateral leads, cannot exclude ischemia No changes compared to EKG 08/16/2018  Assessment     ICD-10-CM   1. Coronary artery disease involving native coronary artery of native heart without angina pectoris  I25.10 EKG 12-Lead    CBC    TSH    Basic metabolic panel    Lipid Panel With LDL/HDL Ratio    PCV ECHOCARDIOGRAM COMPLETE    metoprolol succinate (TOPROL-XL) 50 MG 24 hr tablet  2. Essential hypertension  I10 amLODipine (NORVASC) 10 MG tablet    CBC    TSH    Basic metabolic panel    Lipid Panel With LDL/HDL Ratio    PCV ECHOCARDIOGRAM COMPLETE    metoprolol succinate (TOPROL-XL) 50 MG 24 hr tablet  3. Sinus tachycardia by electrocardiogram  R00.0   4. Nonspecific abnormal electrocardiogram (ECG) (EKG)  R94.31 PCV ECHOCARDIOGRAM COMPLETE  5. Uncontrolled type 2 diabetes mellitus, with long-term current use of insulin ([facility])  E11.65 Hgb A1c w/o  eAG   Z79.4   6. Tobacco use disorder  F17.200      Medications Discontinued During This Encounter  Medication Reason   amoxicillin (AMOXIL) 500 MG tablet Completed Course   metoprolol tartrate (LOPRESSOR) 25 MG tablet Change in therapy   amLODipine (NORVASC) 5 MG tablet     Meds ordered this encounter  Medications   amLODipine (NORVASC) 10 MG tablet    Sig: Take 1 tablet (10 mg total) by mouth daily.    Dispense:  30 tablet    Refill:  3   metoprolol succinate (TOPROL-XL) 50 MG 24 hr tablet    Sig: Take 1 tablet (50 mg total) by mouth at bedtime. Take with or immediately following a meal.    Dispense:  30 tablet    Refill:  3    Recommendations:   Javier Brown is a 54 y.o. male  with history of CAD s/p stent to LAD in 2004 and again in 2010, hypertension, hyperlipidemia, GERD, tobacco use, and type 2 diabetes, evaluated by [place] for symptoms of angina in the form of indegestion. He underwent nuclear stress test Lexiscan nuclear stress test on 09/09/2018 that was without perfusion abnormalities and was considered low risk study.  Echocardiogram also performed at that time revealed normal LVEF.  Patient had improvement with indigestion symptoms that were thought to be angina equivalent with addition of amlodipine.  Patient presents for annual follow-up, states he is presently doing well without symptoms or recurrence of angina.  Blood pressure was markedly elevated in the office today, upon recheck had improved some to 144/82 however remains above goal.  We will continue will increase amlodipine from 5 mg to 10 mg daily, continue lisinopril 10 mg daily, and resume metoprolol succinate 50 mg daily.  EKG today revealed patient in sinus tachycardia at rest, metoprolol will likely improve heart rate as well.  Discussed at length with patient his high caffeine intake throughout the day and smoking are likely contributing to tachycardia.  Educated on the importance of reducing caffeine intake,  patient expressed understanding and agreement.  Also discussed smoking cessation, patient states he will try using nicotine replacement therapy.  In view of EKG changes noted today, will  obtain echocardiogram.  Also patient has not been followed by his PCP in approximately 2 years, will obtain BMP, CBC, A1c, lipid profile testing, and TSH. Labs will also contribute to assessing etiology of resting sinus tachycardia.  Discussed with patient the importance of following regularly with primary care provider as she is responsible for his diabetes management.  Discussed regarding healthy lifestyle modifications.  Follow-up in 4 weeks for hypertension, sinus tachycardia, results of echocardiogram.   During this visit I reviewed and updated: Tobacco history   allergies  medication reconciliation   medical history   surgical history   family history   social history.  This note was created using a voice recognition software as a result there may be grammatical errors inadvertently enclosed that do not reflect the nature of this encounter. Every attempt is made to correct such errors.   Alethia Berthold, PA-C 01/14/2020, 5:24 PM Office: [phone]

## 2020-01-14 ENCOUNTER — Encounter: Payer: Self-pay | Admitting: Student

## 2020-01-14 ENCOUNTER — Other Ambulatory Visit: Payer: Self-pay

## 2020-01-14 ENCOUNTER — Ambulatory Visit: Payer: Medicare Other | Admitting: Student

## 2020-01-14 VITALS — BP 144/82 | HR 111 | Resp 16 | Ht 69.0 in | Wt 154.0 lb

## 2020-01-14 DIAGNOSIS — I2510 Atherosclerotic heart disease of native coronary artery without angina pectoris: Secondary | ICD-10-CM | POA: Diagnosis not present

## 2020-01-14 DIAGNOSIS — I10 Essential (primary) hypertension: Secondary | ICD-10-CM | POA: Diagnosis not present

## 2020-01-14 DIAGNOSIS — E1165 Type 2 diabetes mellitus with hyperglycemia: Secondary | ICD-10-CM | POA: Diagnosis not present

## 2020-01-14 DIAGNOSIS — R9431 Abnormal electrocardiogram [ECG] [EKG]: Secondary | ICD-10-CM | POA: Diagnosis not present

## 2020-01-14 DIAGNOSIS — Z794 Long term (current) use of insulin: Secondary | ICD-10-CM | POA: Diagnosis not present

## 2020-01-14 DIAGNOSIS — R000 Tachycardia, unspecified: Secondary | ICD-10-CM | POA: Diagnosis not present

## 2020-01-14 DIAGNOSIS — F17200 Nicotine dependence, unspecified, uncomplicated: Secondary | ICD-10-CM

## 2020-01-14 DIAGNOSIS — IMO0002 Reserved for concepts with insufficient information to code with codable children: Secondary | ICD-10-CM

## 2020-01-14 DIAGNOSIS — F17210 Nicotine dependence, cigarettes, uncomplicated: Secondary | ICD-10-CM | POA: Diagnosis not present

## 2020-01-14 DIAGNOSIS — N529 Male erectile dysfunction, unspecified: Secondary | ICD-10-CM

## 2020-01-14 MED ORDER — AMLODIPINE BESYLATE 10 MG PO TABS: 10.0000 mg | ORAL_TABLET | Freq: Every day | ORAL | 3 refills | Status: DC

## 2020-01-14 MED ORDER — METOPROLOL SUCCINATE ER 50 MG PO TB24: 50.0000 mg | ORAL_TABLET | Freq: Every evening | ORAL | 3 refills | Status: DC

## 2020-01-14 NOTE — Patient Instructions (Signed)
Blood work at [facility].  

## 2020-01-20 ENCOUNTER — Other Ambulatory Visit: Payer: Self-pay

## 2020-01-20 ENCOUNTER — Ambulatory Visit: Payer: Medicare HMO

## 2020-01-20 DIAGNOSIS — R9431 Abnormal electrocardiogram [ECG] [EKG]: Secondary | ICD-10-CM | POA: Diagnosis not present

## 2020-01-20 DIAGNOSIS — E1165 Type 2 diabetes mellitus with hyperglycemia: Secondary | ICD-10-CM | POA: Diagnosis not present

## 2020-01-20 DIAGNOSIS — Z794 Long term (current) use of insulin: Secondary | ICD-10-CM | POA: Diagnosis not present

## 2020-01-20 DIAGNOSIS — I10 Essential (primary) hypertension: Secondary | ICD-10-CM

## 2020-01-20 DIAGNOSIS — I2510 Atherosclerotic heart disease of native coronary artery without angina pectoris: Secondary | ICD-10-CM

## 2020-01-21 ENCOUNTER — Telehealth: Payer: Self-pay

## 2020-01-21 LAB — CBC
Hematocrit: 44.1 % (ref 37.5–51.0)
Hemoglobin: 14.5 g/dL (ref 13.0–17.7)
MCH: 31.8 pg (ref 26.6–33.0)
MCHC: 32.9 g/dL (ref 31.5–35.7)
MCV: 97 fL (ref 79–97)
Platelets: 256 10*3/uL (ref 150–450)
RBC: 4.56 x10E6/uL (ref 4.14–5.80)
RDW: 12.9 % (ref 11.6–15.4)
WBC: 5.9 10*3/uL (ref 3.4–10.8)

## 2020-01-21 LAB — LIPID PANEL WITH LDL/HDL RATIO
Cholesterol, Total: 166 mg/dL (ref 100–199)
HDL: 41 mg/dL (ref >39)
LDL Chol Calc (NIH): 110 mg/dL — ABNORMAL HIGH (ref 0–99)
LDL/HDL Ratio: 2.7 ratio (ref 0.0–3.6)
Triglycerides: 78 mg/dL (ref 0–149)
VLDL Cholesterol Cal: 15 mg/dL (ref 5–40)

## 2020-01-21 LAB — BASIC METABOLIC PANEL
BUN/Creatinine Ratio: 22 — ABNORMAL HIGH (ref 9–20)
BUN: 21 mg/dL (ref 6–24)
CO2: 21 mmol/L (ref 20–29)
Calcium: 10 mg/dL (ref 8.7–10.2)
Chloride: 92 mmol/L — ABNORMAL LOW (ref 96–106)
Creatinine, Ser: 0.97 mg/dL (ref 0.76–1.27)
GFR calc Af Amer: 101 mL/min/{1.73_m2} (ref >59)
GFR calc non Af Amer: 88 mL/min/{1.73_m2} (ref >59)
Glucose: 260 mg/dL — ABNORMAL HIGH (ref 65–99)
Potassium: 5.1 mmol/L (ref 3.5–5.2)
Sodium: 133 mmol/L — ABNORMAL LOW (ref 134–144)

## 2020-01-21 LAB — TSH: TSH: 0.497 u[IU]/mL (ref 0.450–4.500)

## 2020-01-21 LAB — HGB A1C W/O EAG: Hgb A1c MFr Bld: 12.6 % — ABNORMAL HIGH (ref 4.8–5.6)

## 2020-01-21 NOTE — Telephone Encounter (Signed)
Error

## 2020-01-21 NOTE — Progress Notes (Signed)
Please inform patient blood counts, thyroid function, renal function, electrolytes are normal.  His cholesterol is high, will discuss at upcoming visit.  Hemoglobin A1c, which is a measure of average sugar levels is very high.  Patient needs to make appointment to be seen by his PCP for diabetes management.  Please verify patient's PCP and fax recent lab results to their office.

## 2020-01-21 NOTE — Telephone Encounter (Signed)
-----   Message from Rayford Halsted, [place] sent at 01/21/2020  9:07 AM EDT ----- Please inform patient blood counts, thyroid function, renal function, electrolytes are normal.  His cholesterol is high, will discuss at upcoming visit.  Hemoglobin A1c, which is a measure of average sugar levels is very high.  Patient needs to make  appointment to be seen by his PCP for diabetes management.  Please verify patient's PCP and fax recent lab results to their office.

## 2020-01-21 NOTE — Telephone Encounter (Signed)
Spoke with patient regarding lab results. Will fax recent lab work to verified PCP. Patient voiced understanding.

## 2020-01-23 NOTE — Progress Notes (Signed)
Called patient, NA, LMAM

## 2020-01-23 NOTE — Progress Notes (Signed)
Please inform patient there is some mild thickening of heart muscle, but echo is essentially unchanged from previous. Will discuss further at upcoming visit.

## 2020-01-28 NOTE — Progress Notes (Signed)
Called and spoke with patient regarding Echocardiogram results. Patient informed if he has any questions, they will be discussed during the office visit. AD/S

## 2020-02-09 ENCOUNTER — Ambulatory Visit: Payer: Medicare HMO | Admitting: Student

## 2020-02-24 NOTE — Progress Notes (Deleted)
Primary Physician/Referring:  Everardo Beals, NP  Patient ID: Javier Brown, male    DOB: 11/17/1965, 54 y.o.   MRN: [id number]  No chief complaint on file.  HPI:    Javier Brown  is a 54 y.o. male  with history of CAD s/p stent to LAD in 2004 and again in 2010, hypertension, hyperlipidemia, GERD, tobacco use, and type 2 diabetes, evaluated by [place] for symptoms of angina in the form of indegestion. He underwent nuclear stress test Lexiscan nuclear stress test on 09/09/2018 that was without perfusion abnormalities and was considered low risk study.  Echocardiogram also performed at that time revealed normal LVEF.  Patient had improvement with indigestion symptoms that were thought to be angina equivalent with addition of amlodipine.  Presents now for annual follow-up.  States he is presently doing well without symptoms.  Denies chest pain, shortness of breath, palpitations, leg edema, PND, orthopnea or symptoms suggestive of TIA/CVA.  He is presently staying active working part-time at [employer], as well as walking 1-1.5 hours 3-4 times per week and playing with his grandkids.  Unfortunately he continues to smoke approximately 1 pack/day and occasionally drinks alcohol.  He has been lost to follow-up with his PCP and has not been seen in about 2 years, she was previously managing his diabetes mellitus.  Of note patient states he drinks 3 to 4 cups of coffee each morning and soda throughout the day.  ***Patient presents for 4 week follow up of hypertension, sinus tachycardia, and cardiac testing results. At last visit increased amlodipine from 5 mg to 10 mg daily and resumed metoprolol 50 mg daily.   ***See PCP for DM? Smoking?   Past Medical History:  Diagnosis Date  . Acute anterior wall MI ([place])   . CAD (coronary artery disease)   . Chronic back pain   . Diabetes mellitus without complication ([place])   . DKA (diabetic ketoacidoses)   . ED (erectile dysfunction)   . GERD (gastroesophageal reflux  disease)   . Hyperlipidemia    Past Surgical History:  Procedure Laterality Date  . CORONARY ANGIOPLASTY WITH STENT PLACEMENT    . HIP SURGERY    . LUMBAR FUSION    . SHOULDER SURGERY     Family History  Problem Relation Age of Onset  . Heart disease Mother   . Heart attack Mother   . Diabetes Sister     Social History   Tobacco Use  . Smoking status: Current Every Day Smoker    Packs/day: 0.50    Types: Cigarettes  . Smokeless tobacco: Never Used  Substance Use Topics  . Alcohol use: Yes    Alcohol/week: 4.0 standard drinks    Types: 4 Cans of beer per week    Comment: per week   Marital Status: Divorced   ROS  Review of Systems  Constitutional: Negative for malaise/fatigue and weight gain.  Cardiovascular: Negative for chest pain, claudication, leg swelling, near-syncope, orthopnea, palpitations, paroxysmal nocturnal dyspnea and syncope.  Respiratory: Negative for shortness of breath.   Hematologic/Lymphatic: Does not bruise/bleed easily.  Gastrointestinal: Negative for melena.  Neurological: Negative for dizziness and weakness.    Objective  There were no vitals taken for this visit.  Vitals with BMI 01/14/2020 01/09/2019 01/09/2019  Height 5' 9" - 5' 9"  Weight 154 lbs - 168 lbs  BMI 16.38 - 46.6  Systolic 599 357 017  Diastolic 82 83 85  Pulse 793 - 79      Physical Exam Vitals  reviewed.  HENT:     Head: Normocephalic and atraumatic.  Cardiovascular:     Rate and Rhythm: Regular rhythm. Tachycardia present.     Pulses: Intact distal pulses.     Heart sounds: S1 normal and S2 normal. No murmur heard.  No gallop.      Comments: Ejection click.  Pulmonary:     Effort: Pulmonary effort is normal. No respiratory distress.     Breath sounds: No wheezing, rhonchi or rales.  Musculoskeletal:     Right lower leg: No edema.     Left lower leg: No edema.  Neurological:     Mental Status: He is alert.     Laboratory examination:   Recent Labs     01/20/20 1414  NA 133*  K 5.1  CL 92*  CO2 21  GLUCOSE 260*  BUN 21  CREATININE 0.97  CALCIUM 10.0  GFRNONAA 88  GFRAA 101   CrCl cannot be calculated (Patient's most recent lab result is older than the maximum 21 days allowed.).  CMP Latest Ref Rng & Units 01/20/2020  Glucose 65 - 99 mg/dL 260(H)  BUN 6 - 24 mg/dL 21  Creatinine 0.76 - 1.27 mg/dL 0.97  Sodium 134 - 144 mmol/L 133(L)  Potassium 3.5 - 5.2 mmol/L 5.1  Chloride 96 - 106 mmol/L 92(L)  CO2 20 - 29 mmol/L 21  Calcium 8.7 - 10.2 mg/dL 10.0   CBC Latest Ref Rng & Units 01/20/2020  WBC 3.4 - 10.8 x10E3/uL 5.9  Hemoglobin 13.0 - 17.7 g/dL 14.5  Hematocrit 37.5 - 51.0 % 44.1  Platelets 150 - 450 x10E3/uL 256    Lipid Panel Recent Labs    01/20/20 1414  CHOL 166  TRIG 78  LDLCALC 110*  HDL 41    HEMOGLOBIN A1C Lab Results  Component Value Date   HGBA1C 12.6 (H) 01/20/2020   TSH Recent Labs    01/20/20 1414  TSH 0.497    External labs:   07/06/2017: Na 132, K 4.4, Creatinine 0.82, Glucose 147, eGFR >90, CMP otherwise normal. RBC 4.4, CBC otherwise normal.   Medications and allergies   Allergies  Allergen Reactions  . Coumadin [Warfarin Sodium] Hives     Outpatient Medications Prior to Visit  Medication Sig Dispense Refill  . amLODipine (NORVASC) 10 MG tablet Take 1 tablet (10 mg total) by mouth daily. 30 tablet 3  . aspirin EC 81 MG tablet Take 1 tablet by mouth daily.    . insulin aspart (NOVOLOG FLEXPEN) 100 UNIT/ML FlexPen Inject 8 Units as directed 3 (three) times daily.    Marland Kitchen lisinopril (ZESTRIL) 10 MG tablet Take 1 tablet by mouth daily.    . metoprolol succinate (TOPROL-XL) 50 MG 24 hr tablet Take 1 tablet (50 mg total) by mouth at bedtime. Take with or immediately following a meal. 30 tablet 3  . nicotine (NICODERM CQ - DOSED IN MG/24 HOURS) 14 mg/24hr patch Place 1 patch (14 mg total) onto the skin daily. (Patient not taking: Reported on 01/14/2020) 14 patch 0  . nitroGLYCERIN  (NITROSTAT) 0.4 MG SL tablet Place 1 tablet (0.4 mg total) under the tongue every 5 (five) minutes as needed for chest pain. 25 tablet 3  . omeprazole (PRILOSEC) 20 MG capsule Take 20 mg by mouth daily.    . rosuvastatin (CRESTOR) 10 MG tablet Take 1 tablet by mouth daily.    . sildenafil (VIAGRA) 25 MG tablet Take 1 tablet (25 mg total) by mouth daily as needed for  erectile dysfunction (Do not use within 48 hours of nitroglycerin use). 10 tablet 1  . Zinc Sulfate (ZINC 15 PO) Take by mouth.     No facility-administered medications prior to visit.     Radiology:   No results found.  Cardiac Studies:   PCV ECHOCARDIOGRAM COMPLETE 15/17/6160 Normal LV systolic function with visual EF 55-60%. Left ventricle cavity is normal in size. Normal global wall motion. Normal diastolic filling pattern, normal LAP. Right ventricle cavity is normal in size. Normal right ventricular function. Mild concentric hypertrophy of the right ventricle. Mild mitral valve leaflet thickening. Normal mitral valve leaflet mobility. Trace mitral regurgitation. No evidence of mitral valve stenosis. Compared to prior study dated 09/18/2018 no significant changes noted expect RVH.  PCV ECHOCARDIOGRAM COMPLETE 73/71/0626 Normal LV systolic function with EF 50-55%. Left ventricle cavity is normal in size. Mild concentric hypertrophy of the left ventricle. Normal global wall motion. Normal diastolic filling pattern. No significant valvular abnormality. Normal right atrial pressure.  Lexiscan Myoview Stress Test06/22/2020: Resting EKG demonstrated normal sinus rhythm. Nonspecific T-wave abnormality. Stress EKG is nondiagnostic. Perfusion imaging study demonstrates mild apical thinning but no demonstrable ischemia or scar. LV systolic function was 94% without regional wall motion abnormality. This is a low risk study.  EKG:  ***  EKG 01/14/2020: Sinus tachycardia at a rate of 111 bpm, right atrial enlargement.   Normal axis.  Poor R wave progression, cannot exclude anterior infarct old.  Compared to EKG 01/09/2019, tachycardia, right atrial enlargement new.  EKG 01/09/2019: Normal sinus rhythm at 78 bpm, normal axis, anterior infarct old. T wave inversion in inferolateral leads, cannot exclude ischemia No changes compared to EKG 08/16/2018  Assessment   No diagnosis found.   There are no discontinued medications.  No orders of the defined types were placed in this encounter.   Recommendations:   Javier Brown is a 54 y.o. male  with history of CAD s/p stent to LAD in 2004 and again in 2010, hypertension, hyperlipidemia, GERD, tobacco use, and type 2 diabetes, evaluated by [place] for symptoms of angina in the form of indegestion. He underwent nuclear stress test Lexiscan nuclear stress test on 09/09/2018 that was without perfusion abnormalities and was considered low risk study.  Echocardiogram also performed at that time revealed normal LVEF.  Patient had improvement with indigestion symptoms that were thought to be angina equivalent with addition of amlodipine.  Patient presents for annual follow-up, states he is presently doing well without symptoms or recurrence of angina.  Blood pressure was markedly elevated in the office today, upon recheck had improved some to 144/82 however remains above goal.  We will continue will increase amlodipine from 5 mg to 10 mg daily, continue lisinopril 10 mg daily, and resume metoprolol succinate 50 mg daily.  EKG today revealed patient in sinus tachycardia at rest, metoprolol will likely improve heart rate as well.  Discussed at length with patient his high caffeine intake throughout the day and smoking are likely contributing to tachycardia.  Educated on the importance of reducing caffeine intake, patient expressed understanding and agreement.  Also discussed smoking cessation, patient states he will try using nicotine replacement therapy.  In view of EKG changes noted today,  will obtain echocardiogram.  Also patient has not been followed by his PCP in approximately 2 years, will obtain BMP, CBC, A1c, lipid profile testing, and TSH. Labs will also contribute to assessing etiology of resting sinus tachycardia.  Discussed with patient the importance of following regularly with primary  care provider as she is responsible for his diabetes management.  Discussed regarding healthy lifestyle modifications.  Follow-up in 4 weeks for hypertension, sinus tachycardia, results of echocardiogram.   During this visit I reviewed and updated: Tobacco history  allergies medication reconciliation  medical history  surgical history  family history  social history.  This note was created using a voice recognition software as a result there may be grammatical errors inadvertently enclosed that do not reflect the nature of this encounter. Every attempt is made to correct such errors.  ***  Please inform patient blood counts, thyroid function, renal function, electrolytes are normal.  His cholesterol is high, will discuss at upcoming visit.  Hemoglobin A1c, which is a measure of average sugar levels is very high.  Patient needs to make  appointment to be seen by his PCP for diabetes management. Please inform patient there is some mild thickening of heart muscle, but echo is essentially unchanged

## 2020-02-26 ENCOUNTER — Ambulatory Visit: Payer: Medicare HMO | Admitting: Student

## 2020-02-26 DIAGNOSIS — I2510 Atherosclerotic heart disease of native coronary artery without angina pectoris: Secondary | ICD-10-CM

## 2020-02-26 DIAGNOSIS — E1165 Type 2 diabetes mellitus with hyperglycemia: Secondary | ICD-10-CM

## 2020-02-26 DIAGNOSIS — R000 Tachycardia, unspecified: Secondary | ICD-10-CM

## 2020-02-26 DIAGNOSIS — I10 Essential (primary) hypertension: Secondary | ICD-10-CM

## 2020-02-26 DIAGNOSIS — F17200 Nicotine dependence, unspecified, uncomplicated: Secondary | ICD-10-CM

## 2020-03-03 ENCOUNTER — Other Ambulatory Visit: Payer: Self-pay

## 2020-03-03 ENCOUNTER — Encounter: Payer: Self-pay | Admitting: Student

## 2020-03-03 ENCOUNTER — Ambulatory Visit: Payer: Medicare Other | Admitting: Student

## 2020-03-03 VITALS — BP 124/74 | HR 91 | Resp 16 | Ht 69.0 in | Wt 155.0 lb

## 2020-03-03 DIAGNOSIS — IMO0002 Reserved for concepts with insufficient information to code with codable children: Secondary | ICD-10-CM

## 2020-03-03 DIAGNOSIS — R000 Tachycardia, unspecified: Secondary | ICD-10-CM | POA: Diagnosis not present

## 2020-03-03 DIAGNOSIS — I10 Essential (primary) hypertension: Secondary | ICD-10-CM | POA: Diagnosis not present

## 2020-03-03 DIAGNOSIS — I2510 Atherosclerotic heart disease of native coronary artery without angina pectoris: Secondary | ICD-10-CM

## 2020-03-03 DIAGNOSIS — F17210 Nicotine dependence, cigarettes, uncomplicated: Secondary | ICD-10-CM | POA: Diagnosis not present

## 2020-03-03 DIAGNOSIS — N529 Male erectile dysfunction, unspecified: Secondary | ICD-10-CM

## 2020-03-03 DIAGNOSIS — Z794 Long term (current) use of insulin: Secondary | ICD-10-CM | POA: Diagnosis not present

## 2020-03-03 DIAGNOSIS — E1165 Type 2 diabetes mellitus with hyperglycemia: Secondary | ICD-10-CM | POA: Diagnosis not present

## 2020-03-03 MED ORDER — SILDENAFIL CITRATE 25 MG PO TABS: 25.0000 mg | ORAL_TABLET | Freq: Every day | ORAL | 3 refills | Status: DC | PRN

## 2020-03-03 MED ORDER — ROSUVASTATIN CALCIUM 20 MG PO TABS: 20.0000 mg | ORAL_TABLET | Freq: Every day | ORAL | 3 refills | Status: AC

## 2020-03-03 NOTE — Progress Notes (Signed)
Primary Physician/Referring:  Everardo Beals, NP  Patient ID: Javier Brown, male    DOB: Nov 27, 1965, 54 y.o.   MRN: [id number]  Chief Complaint  Patient presents with  . Hypertension  . Tachycardia  . Follow-up    4 week   HPI:    Javier Brown  is a 54 y.o. male  with history of CAD s/p stent to LAD in 2004 and again in 2010, hypertension, hyperlipidemia, GERD, tobacco use, and type 2 diabetes, evaluated by [place] for symptoms of angina in the form of indegestion. He underwent normal nuclear stress test Lexiscan nuclear stress test and echocardiogram with normal LVEF in 08/2018. Patient had improvement with indigestion symptoms that were thought to be angina equivalent with addition of amlodipine. Repeat echocardiogram 01/2020 showed mild concentric hypertrophy of the RV, was otherwise unchanged.   Patient presents for 4 week follow up of hypertension, sinus tachycardia, and cardiac testing results. At last visit increased amlodipine from 5 mg to 10 mg daily and resumed metoprolol 50 mg daily.  Patient is presently doing well and remains asymptomatic.  Denies chest pain, dyspnea, palpitations, edema.  He continues to work part-time at [employer], he walks to work without issue.  Unfortunately he continues to smoke half a pack per day, but this is improved from a full pack per day.  He has nicotine patches at home which he plans to use to quit smoking.  Patient has also cut back on his caffeine intake from 3-4 cups of coffee per day to 2 cups each morning.  He does continue to drink soda throughout the day.  Unfortunately patient has not followed up with his PCP in regards to diabetes management.  Past Medical History:  Diagnosis Date  . Acute anterior wall MI ([place])   . CAD (coronary artery disease)   . Chronic back pain   . Diabetes mellitus without complication ([place])   . DKA (diabetic ketoacidoses)   . ED (erectile dysfunction)   . GERD (gastroesophageal reflux disease)   . Hyperlipidemia     Past Surgical History:  Procedure Laterality Date  . CORONARY ANGIOPLASTY WITH STENT PLACEMENT    . HIP SURGERY    . LUMBAR FUSION    . SHOULDER SURGERY     Family History  Problem Relation Age of Onset  . Heart disease Mother   . Heart attack Mother   . Diabetes Sister     Social History   Tobacco Use  . Smoking status: Current Every Day Smoker    Packs/day: 0.50    Types: Cigarettes  . Smokeless tobacco: Never Used  Substance Use Topics  . Alcohol use: Yes    Alcohol/week: 4.0 standard drinks    Types: 4 Cans of beer per week    Comment: per week   Marital Status: Divorced   ROS  Review of Systems  Constitutional: Negative for malaise/fatigue and weight gain.  Cardiovascular: Negative for chest pain, claudication, leg swelling, near-syncope, orthopnea, palpitations, paroxysmal nocturnal dyspnea and syncope.  Respiratory: Negative for shortness of breath.   Hematologic/Lymphatic: Does not bruise/bleed easily.  Gastrointestinal: Negative for melena.  Neurological: Negative for dizziness and weakness.    Objective  Blood pressure 124/74, pulse 91, resp. rate 16, height _0  (1.753 m), weight 155 lb (70.3 kg), SpO2 99 %.  Vitals with BMI 03/03/2020 01/14/2020 01/09/2019  Height _1  _2  -  Weight 155 lbs 154 lbs -  BMI 73.53 29.92 -  Systolic 426 834 196  Diastolic  74 82 83  Pulse 91 111 -      Physical Exam Vitals reviewed.  HENT:     Head: Normocephalic and atraumatic.  Cardiovascular:     Rate and Rhythm: Normal rate and regular rhythm.     Pulses: Intact distal pulses.     Heart sounds: S1 normal and S2 normal. No murmur heard. No gallop.      Comments: Ejection click.  Pulmonary:     Effort: Pulmonary effort is normal. No respiratory distress.     Breath sounds: No wheezing, rhonchi or rales.  Musculoskeletal:     Right lower leg: No edema.     Left lower leg: No edema.  Neurological:     Mental Status: He is alert.     Laboratory  examination:   Recent Labs    01/20/20 1414  NA 133*  K 5.1  CL 92*  CO2 21  GLUCOSE 260*  BUN 21  CREATININE 0.97  CALCIUM 10.0  GFRNONAA 88  GFRAA 101   CrCl cannot be calculated (Patient's most recent lab result is older than the maximum 21 days allowed.).  CMP Latest Ref Rng & Units 01/20/2020  Glucose 65 - 99 mg/dL 260(H)  BUN 6 - 24 mg/dL 21  Creatinine 0.76 - 1.27 mg/dL 0.97  Sodium 134 - 144 mmol/L 133(L)  Potassium 3.5 - 5.2 mmol/L 5.1  Chloride 96 - 106 mmol/L 92(L)  CO2 20 - 29 mmol/L 21  Calcium 8.7 - 10.2 mg/dL 10.0   CBC Latest Ref Rng & Units 01/20/2020  WBC 3.4 - 10.8 x10E3/uL 5.9  Hemoglobin 13.0 - 17.7 g/dL 14.5  Hematocrit 37.5 - 51.0 % 44.1  Platelets 150 - 450 x10E3/uL 256    Lipid Panel Recent Labs    01/20/20 1414  CHOL 166  TRIG 78  LDLCALC 110*  HDL 41    HEMOGLOBIN A1C Lab Results  Component Value Date   HGBA1C 12.6 (H) 01/20/2020   TSH Recent Labs    01/20/20 1414  TSH 0.497    External labs:   07/06/2017: Na 132, K 4.4, Creatinine 0.82, Glucose 147, eGFR >90, CMP otherwise normal. RBC 4.4, CBC otherwise normal.   Medications and allergies   Allergies  Allergen Reactions  . Coumadin [Warfarin Sodium] Hives     Outpatient Medications Prior to Visit  Medication Sig Dispense Refill  . amLODipine (NORVASC) 10 MG tablet Take 1 tablet (10 mg total) by mouth daily. 30 tablet 3  . aspirin EC 81 MG tablet Take 1 tablet by mouth daily.    . insulin aspart (NOVOLOG) 100 UNIT/ML FlexPen Inject 8 Units as directed 3 (three) times daily.    Marland Kitchen lisinopril (ZESTRIL) 10 MG tablet Take 1 tablet by mouth daily.    . metoprolol succinate (TOPROL-XL) 50 MG 24 hr tablet Take 1 tablet (50 mg total) by mouth at bedtime. Take with or immediately following a meal. 30 tablet 3  . nitroGLYCERIN (NITROSTAT) 0.4 MG SL tablet Place 1 tablet (0.4 mg total) under the tongue every 5 (five) minutes as needed for chest pain. 25 tablet 3  . Zinc Sulfate  (ZINC 15 PO) Take by mouth.    . nicotine (NICODERM CQ - DOSED IN MG/24 HOURS) 14 mg/24hr patch Place 1 patch (14 mg total) onto the skin daily. (Patient not taking: Reported on 03/03/2020) 14 patch 0  . omeprazole (PRILOSEC) 20 MG capsule Take 20 mg by mouth daily.    . rosuvastatin (CRESTOR) 10 MG tablet  Take 1 tablet by mouth daily.    . sildenafil (VIAGRA) 25 MG tablet Take 1 tablet (25 mg total) by mouth daily as needed for erectile dysfunction (Do not use within 48 hours of nitroglycerin use). 10 tablet 1   No facility-administered medications prior to visit.     Radiology:   No results found.  Cardiac Studies:   PCV ECHOCARDIOGRAM COMPLETE 41/96/2229 Normal LV systolic function with visual EF 55-60%. Left ventricle cavity is normal in size. Normal global wall motion. Normal diastolic filling pattern, normal LAP. Right ventricle cavity is normal in size. Normal right ventricular function. Mild concentric hypertrophy of the right ventricle. Mild mitral valve leaflet thickening. Normal mitral valve leaflet mobility. Trace mitral regurgitation. No evidence of mitral valve stenosis. Compared to prior study dated 09/18/2018 no significant changes noted expect RVH.  PCV ECHOCARDIOGRAM COMPLETE 79/89/2119 Normal LV systolic function with EF 50-55%. Left ventricle cavity is normal in size. Mild concentric hypertrophy of the left ventricle. Normal global wall motion. Normal diastolic filling pattern. No significant valvular abnormality. Normal right atrial pressure.  Lexiscan Myoview Stress Test06/22/2020: Resting EKG demonstrated normal sinus rhythm. Nonspecific T-wave abnormality. Stress EKG is nondiagnostic. Perfusion imaging study demonstrates mild apical thinning but no demonstrable ischemia or scar. LV systolic function was 41% without regional wall motion abnormality. This is a low risk study.  EKG:   EKG 03/03/2020: Sinus rhythm at a rate of 92 bpm, left atrial  enlargement.  Left axis.  Poor R wave correction, cannot exclude anteroseptal infarct old.  T wave abnormality lateral leads, probably artifact.  Compared to EKG 01/14/2020, no significant change.  EKG 01/14/2020: Sinus tachycardia at a rate of 111 bpm, right atrial enlargement.  Normal axis.  Poor R wave progression, cannot exclude anterior infarct old.  Compared to EKG 01/09/2019, tachycardia, right atrial enlargement new.  EKG 01/09/2019: Normal sinus rhythm at 78 bpm, normal axis, anterior infarct old. T wave inversion in inferolateral leads, cannot exclude ischemia No changes compared to EKG 08/16/2018  Assessment     ICD-10-CM   1. Coronary artery disease involving native coronary artery of native heart without angina pectoris  I25.10 Lipid Panel With LDL/HDL Ratio    Lipid Panel With LDL/HDL Ratio  2. Essential hypertension  I10 EKG 12-Lead  3. Sinus tachycardia by electrocardiogram  R00.0   4. Uncontrolled type 2 diabetes mellitus, with long-term current use of insulin (HCC)  E11.65    Z79.4   5. Erectile dysfunction, unspecified erectile dysfunction type  N52.9 sildenafil (VIAGRA) 25 MG tablet     Medications Discontinued During This Encounter  Medication Reason  . omeprazole (PRILOSEC) 20 MG capsule Patient Preference  . rosuvastatin (CRESTOR) 10 MG tablet Patient Preference  . sildenafil (VIAGRA) 25 MG tablet Patient Preference    Meds ordered this encounter  Medications  . rosuvastatin (CRESTOR) 20 MG tablet    Sig: Take 1 tablet (20 mg total) by mouth daily.    Dispense:  90 tablet    Refill:  3  . sildenafil (VIAGRA) 25 MG tablet    Sig: Take 1 tablet (25 mg total) by mouth daily as needed for erectile dysfunction (Do not use within 48 hours of nitroglycerin use).    Dispense:  10 tablet    Refill:  3    Recommendations:   Javier Brown is a 54 y.o. male  with history of CAD s/p stent to LAD in 2004 and again in 2010, hypertension, hyperlipidemia, GERD, tobacco use,  and type 2  diabetes, evaluated by [place] for symptoms of angina in the form of indegestion. He underwent normal nuclear stress test Lexiscan nuclear stress test and echocardiogram with normal LVEF in 08/2018. Patient had improvement with indigestion symptoms that were thought to be angina equivalent with addition of amlodipine. Repeat echocardiogram 01/2020 showed mild concentric hypertrophy of the RV, was otherwise unchanged.   Patient presents for 4-week follow-up of hypertension, tachycardia, and to discuss results of echocardiogram.  With increased dose of amlodipine as well as addition of metoprolol succinate both blood pressure and heart rate have improved.  Blood pressure is well controlled.  EKG today revealed normal sinus rhythm, however resting heart rate is still in the 90s.  Patient continues to smoke and drink caffeine throughout the day, suspect this is contributing to elevated heart rate.  Counseled patient regarding smoking cessation as well as continuing to cut back on caffeine intake.  Discussed and reviewed results of echocardiogram.  Echocardiogram showed normal LVEF, and mild concentric hypertrophy of the right ventricle but was otherwise unchanged from previous.  Suspect hypertrophy of the right ventricle may be related to underlying pulmonary etiology in view of tobacco use disorder.  Recommend patient follow-up with PCP for evaluation of underlying lung disease.  Also reviewed and discussed recent lab results with patient.  Educated him regarding the importance of diabetes management control as well as reduction in elevated cholesterol in order to reduce cardiovascular risk.  Encourage patient to schedule an urgent appointment with PCP for management of diabetes mellitus.  In view of elevated LDL as well as diabetes will increase atorvastatin from 10 mg to 20 mg daily.  We will repeat lipid profile testing in 6 months prior to follow-up in our office.  We will continue other cardiovascular  medications including amlodipine, aspirin, lisinopril, metoprolol succinate, and as needed nitroglycerin.  Patient today is requesting refill of sildenafil for erectile dysfunction.  Will refill prescription.  Discussed with patient regarding precautions of sublingual nitroglycerin and sildenafil.  Educated patient that he is not to take the medications within 48 hours of each other.  Patient verbalized understanding and agreement.  Patient is presently stable from a cardiovascular standpoint.  Follow-up in 6 months for coronary artery disease, hypertension, hyperlipidemia, tobacco use.   This was a 33-minute encounter with face-to-face counseling, medical records review, coordination of care, explanation of complex medical issues, complex medical decision making.     Alethia Berthold, PA-C 03/03/2020, 3:58 PM Office: [phone]

## 2020-03-03 NOTE — Patient Instructions (Signed)
Blood work 1 week before follow up appointment.

## 2020-04-27 DIAGNOSIS — E119 Type 2 diabetes mellitus without complications: Secondary | ICD-10-CM | POA: Diagnosis not present

## 2020-04-27 DIAGNOSIS — K0889 Other specified disorders of teeth and supporting structures: Secondary | ICD-10-CM | POA: Diagnosis not present

## 2020-05-11 DIAGNOSIS — E119 Type 2 diabetes mellitus without complications: Secondary | ICD-10-CM | POA: Diagnosis not present

## 2020-05-11 DIAGNOSIS — Z1211 Encounter for screening for malignant neoplasm of colon: Secondary | ICD-10-CM | POA: Diagnosis not present

## 2020-06-03 DIAGNOSIS — L03114 Cellulitis of left upper limb: Secondary | ICD-10-CM | POA: Diagnosis not present

## 2020-06-03 DIAGNOSIS — M25532 Pain in left wrist: Secondary | ICD-10-CM | POA: Diagnosis not present

## 2020-06-07 DIAGNOSIS — M25532 Pain in left wrist: Secondary | ICD-10-CM | POA: Diagnosis not present

## 2020-06-07 DIAGNOSIS — L03114 Cellulitis of left upper limb: Secondary | ICD-10-CM | POA: Diagnosis not present

## 2020-06-23 DIAGNOSIS — E119 Type 2 diabetes mellitus without complications: Secondary | ICD-10-CM | POA: Diagnosis not present

## 2020-06-23 DIAGNOSIS — Z0100 Encounter for examination of eyes and vision without abnormal findings: Secondary | ICD-10-CM | POA: Diagnosis not present

## 2020-06-23 DIAGNOSIS — I10 Essential (primary) hypertension: Secondary | ICD-10-CM | POA: Diagnosis not present

## 2020-06-23 DIAGNOSIS — H5210 Myopia, unspecified eye: Secondary | ICD-10-CM | POA: Diagnosis not present

## 2020-09-01 ENCOUNTER — Ambulatory Visit: Payer: Medicare Other | Admitting: Student

## 2020-12-15 ENCOUNTER — Other Ambulatory Visit: Payer: Self-pay | Admitting: Student

## 2020-12-15 DIAGNOSIS — I10 Essential (primary) hypertension: Secondary | ICD-10-CM

## 2021-02-18 ENCOUNTER — Other Ambulatory Visit: Payer: Self-pay | Admitting: Cardiology

## 2021-03-04 ENCOUNTER — Other Ambulatory Visit: Payer: Self-pay | Admitting: Student

## 2021-05-21 ENCOUNTER — Encounter (HOSPITAL_COMMUNITY): Payer: Self-pay | Admitting: Emergency Medicine

## 2021-05-21 ENCOUNTER — Emergency Department (HOSPITAL_COMMUNITY)
Admission: EM | Admit: 2021-05-21 | Discharge: 2021-05-21 | Disposition: A | Discharge status: Home / Self Care | Payer: Medicare HMO | Attending: Emergency Medicine | Admitting: Emergency Medicine

## 2021-05-21 ENCOUNTER — Other Ambulatory Visit: Payer: Self-pay

## 2021-05-21 ENCOUNTER — Emergency Department (HOSPITAL_COMMUNITY): Payer: Medicare HMO

## 2021-05-21 DIAGNOSIS — E119 Type 2 diabetes mellitus without complications: Secondary | ICD-10-CM | POA: Diagnosis not present

## 2021-05-21 DIAGNOSIS — W010XXA Fall on same level from slipping, tripping and stumbling without subsequent striking against object, initial encounter: Secondary | ICD-10-CM | POA: Insufficient documentation

## 2021-05-21 DIAGNOSIS — Z794 Long term (current) use of insulin: Secondary | ICD-10-CM | POA: Insufficient documentation

## 2021-05-21 DIAGNOSIS — R0781 Pleurodynia: Secondary | ICD-10-CM

## 2021-05-21 DIAGNOSIS — Z7982 Long term (current) use of aspirin: Secondary | ICD-10-CM | POA: Insufficient documentation

## 2021-05-21 DIAGNOSIS — S29001A Unspecified injury of muscle and tendon of front wall of thorax, initial encounter: Secondary | ICD-10-CM | POA: Diagnosis present

## 2021-05-21 MED ORDER — LIDOCAINE 5 % EX PTCH: 1.0000 | MEDICATED_PATCH | Freq: Once | CUTANEOUS | Status: DC

## 2021-05-21 MED ORDER — KETOROLAC TROMETHAMINE 10 MG PO TABS: 10.0000 mg | ORAL_TABLET | Freq: Four times a day (QID) | ORAL | 0 refills | Status: AC | PRN

## 2021-05-21 MED ORDER — LIDOCAINE 4 % EX PTCH: 1.0000 | MEDICATED_PATCH | CUTANEOUS | 0 refills | Status: DC

## 2021-05-21 MED ORDER — HYDROCODONE-ACETAMINOPHEN 5-325 MG PO TABS
1.0000 | ORAL_TABLET | Freq: Once | ORAL | Status: AC
  Administered 2021-05-21: 1 via ORAL
  Filled 2021-05-21: qty 1

## 2021-05-21 NOTE — ED Provider Notes (Signed)
[facility] EMERGENCY DEPARTMENT Provider Note   CSN: [id number] Arrival date & time: 05/21/21  0857     History  Chief Complaint  Patient presents with   Fall   Rib Injury    Javier Brown is a 56 y.o. male.  Past medical history of MI, GERD, diabetes who presents emergency department after fall.  Patient states that last night he was at home in the bathroom when he slipped and fell.  He states that he fell onto his right side over his tub.  He states that he is having ongoing right-sided rib pain since last night.  Pain is worse with deep inspiration.  He has not taken anything for the pain.  He denies hitting his head or loss of consciousness.  He denies other injuries.  He is not anticoagulated.  He denies lightheadedness or dizziness, chest pain or shortness of breath prior to falling.   Fall Pertinent negatives include no chest pain, no abdominal pain, no headaches and no shortness of breath.      Home Medications Prior to Admission medications   Medication Sig Start Date End Date Taking? Authorizing Provider  amLODipine (NORVASC) 10 MG tablet TAKE 1 TABLET(10 MG) BY MOUTH DAILY 12/17/20   Cantwell, Celeste C, PA-C  aspirin EC 81 MG tablet Take 1 tablet by mouth daily.    [provider]  insulin aspart (NOVOLOG) 100 UNIT/ML FlexPen Inject 8 Units as directed 3 (three) times daily. 11/14/17   [provider]  lisinopril (ZESTRIL) 10 MG tablet Take 1 tablet by mouth daily. 02/24/15   [provider]  metoprolol succinate (TOPROL-XL) 50 MG 24 hr tablet Take 1 tablet (50 mg total) by mouth at bedtime. Take with or immediately following a meal. 01/14/20 05/13/20  Cantwell, Celeste C, PA-C  nicotine (NICODERM CQ - DOSED IN MG/24 HOURS) 14 mg/24hr patch Place 1 patch (14 mg total) onto the skin daily. Patient not taking: Reported on 03/03/2020 08/16/18   Toniann Fail, NP  nitroGLYCERIN (NITROSTAT) 0.4 MG SL tablet Place 1 tablet (0.4  mg total) under the tongue every 5 (five) minutes as needed for chest pain. 08/16/18   Toniann Fail, NP  rosuvastatin (CRESTOR) 20 MG tablet Take 1 tablet (20 mg total) by mouth daily. 03/03/20   Cantwell, Celeste C, PA-C  sildenafil (VIAGRA) 25 MG tablet Take 1 tablet (25 mg total) by mouth daily as needed for erectile dysfunction (Do not use within 48 hours of nitroglycerin use). 03/03/20   Cantwell, Celeste C, PA-C  Zinc Sulfate (ZINC 15 PO) Take by mouth.    [provider]      Allergies    Coumadin [warfarin sodium]    Review of Systems   Review of Systems  Respiratory:  Negative for shortness of breath.   Cardiovascular:  Negative for chest pain and palpitations.  Gastrointestinal:  Negative for abdominal pain and nausea.  Musculoskeletal:  Positive for arthralgias and myalgias. Negative for neck pain and neck stiffness.  Neurological:  Negative for dizziness, syncope, light-headedness and headaches.  Psychiatric/Behavioral:  Negative for confusion.   All other systems reviewed and are negative.  Physical Exam Updated Vital Signs BP 129/69    Pulse 85    Temp 98.2 F (36.8 C) (Oral)    Resp 16    SpO2 97%  Physical Exam Vitals and nursing note reviewed.  Constitutional:      General: He is not in acute distress.    Appearance: Normal  appearance. He is normal weight. He is not ill-appearing.  HENT:     Head: Normocephalic and atraumatic.     Mouth/Throat:     Mouth: Mucous membranes are moist.     Pharynx: Oropharynx is clear.  Eyes:     General: No scleral icterus.    Extraocular Movements: Extraocular movements intact.     Pupils: Pupils are equal, round, and reactive to light.  Cardiovascular:     Rate and Rhythm: Normal rate and regular rhythm.     Pulses: Normal pulses.     Heart sounds: Normal heart sounds. No murmur heard. Pulmonary:     Effort: Pulmonary effort is normal. No respiratory distress.     Breath sounds: Normal breath sounds.   Chest:    Abdominal:     General: Bowel sounds are normal. There is no distension.     Palpations: Abdomen is soft.     Tenderness: There is no abdominal tenderness.  Musculoskeletal:        General: Normal range of motion.     Cervical back: Full passive range of motion without pain, normal range of motion and neck supple. No rigidity or tenderness. No spinous process tenderness or muscular tenderness.       Back:  Skin:    General: Skin is warm and dry.     Capillary Refill: Capillary refill takes less than 2 seconds.     Findings: No ecchymosis or rash.  Neurological:     General: No focal deficit present.     Mental Status: He is alert and oriented to person, place, and time. Mental status is at baseline.  Psychiatric:        Mood and Affect: Mood normal.        Behavior: Behavior normal.        Thought Content: Thought content normal.        Judgment: Judgment normal.    ED Results / Procedures / Treatments   Labs (all labs ordered are listed, but only abnormal results are displayed) Labs Reviewed - No data to display  EKG None  Radiology DG Ribs Unilateral W/Chest Right  Result Date: 05/21/2021 CLINICAL DATA:  Status post fall. EXAM: RIGHT RIBS AND CHEST - 3+ VIEW COMPARISON:  None. FINDINGS: No fracture or other bone lesions are seen involving the ribs. There is no evidence of pneumothorax or pleural effusion. Both lungs are clear. Heart size and mediastinal contours are within normal limits. IMPRESSION: Negative. Electronically Signed   By: Elige Ko M.D.   On: 05/21/2021 10:04    Procedures Procedures   Medications Ordered in ED Medications  lidocaine (LIDODERM) 5 % 1 patch (has no administration in time range)  HYDROcodone-acetaminophen (NORCO/VICODIN) 5-325 MG per tablet 1 tablet (1 tablet Oral Given 05/21/21 1027)   ED Course/ Medical Decision Making/ A&P                           Medical Decision Making Amount and/or Complexity of Data  Reviewed Radiology: ordered.  Risk OTC drugs. Prescription drug management.  This patient presents to the ED for concern of fall, this involves an extensive number of treatment options, and is a complaint that carries with it a high risk of complications and morbidity.  The differential diagnosis includes rib fracture, pulmonary contusion, head injury, other MSK injury   Co morbidities that complicate the patient evaluation None   Additional history obtained:  Additional history obtained from:  none External records from outside source obtained and reviewed including: Medications  Imaging Studies ordered:  I ordered imaging studies which included x-ray.  I independently reviewed & interpreted imaging & am in agreement with radiology impression. Imaging shows: DG Ribs Unilateral W/Chest Right: no fracture, no pneumothorax or pleural effusion  Medications  I ordered medication including Norco for pain, lidocaine patch for pain Reevaluation of the patient after medication shows that patient improved  Tests Considered: CT Abdomen Pelvis  ED Course: 56 year old male who presents to the emergency department after mechanical fall.  No symptoms such as chest pain, palpitations, lightheadedness or dizziness, syncope prior to or after the fall.  Appears to be mechanical fall after slipping on a rug.  Physical exam, imaging and intervention as described above.  He does have exquisite tenderness to the right rib cage.  Imaging shows no rib fractures, pneumothorax, pleural effusion.  He does have some mild tenderness to palpation of the right side just below the rib cage.  I have very low suspicion for intra-abdominal injury at this time.  Abdomen is flat, soft.  Bowel sounds present.  He is hemodynamically stable after more than 12 hours since fall.  Will not pursue CT imaging of his abdomen at this time.  Likely contusion of the rib cage.  I have given him Norco with improvement in symptoms.   Also given lidocaine patch to wear.  I have given him prescription for Toradol as well as lidocaine patches for home.  Also instructed him on use of incentive spirometer as well as splinting his side when coughing or taking a deep breath.  I have instructed him to use the incentive spirometer at least 5 times a day.  He verbalized understanding.  Given him return precautions for cough and fever concerning for developing pneumonia or worsening abdominal pain.  He verbalized understanding.  Otherwise he can follow-up with primary care.  After consideration of the diagnostic results and the patients response to treatment, I feel that the patent would benefit from discharge. The patient has been appropriately medically screened and/or stabilized in the ED. I have low suspicion for any other emergent medical condition which would require further screening, evaluation or treatment in the ED or require inpatient management. The patient is overall well appearing and non-toxic in appearance. They are hemodynamically stable at time of discharge.   Final Clinical Impression(s) / ED Diagnoses Final diagnoses:  Rib pain on right side    Rx / DC Orders ED Discharge Orders          Ordered    Lidocaine (HM LIDOCAINE PATCH) 4 % PTCH  Every 24 hours        05/21/21 1127    ketorolac (TORADOL) 10 MG tablet  Every 6 hours PRN        05/21/21 1127              Cristopher Peru, PA-C 05/21/21 1139    Wynetta Fines, MD 05/22/21 1646

## 2021-05-21 NOTE — Discharge Instructions (Signed)
You were seen in the emergency department today after a fall last night. WE took a picture of your chest and ribs which showed no fractures. You will likely be sore over the next few days. I have prescribed you lidocaine patches that you can use every 24 hours over the area as well as pain medication. I would like you to use the incentive spirometer breathing device we gave you at least 5 times a day to prevent pneumonia. Please splint your side when coughing or taking deep breaths to prevent pneumonia as well. Please return for worsening pain, cough with fever, or worsening abdominal pain.  ?

## 2021-05-21 NOTE — ED Triage Notes (Signed)
Pt bib [facility] from home reporting mechanical fall last night with pain to right lower cage and lower back, worse with inhalation.  ?

## 2021-06-17 ENCOUNTER — Other Ambulatory Visit: Payer: Self-pay | Admitting: Student

## 2021-06-17 DIAGNOSIS — I10 Essential (primary) hypertension: Secondary | ICD-10-CM

## 2021-08-01 ENCOUNTER — Ambulatory Visit (AMBULATORY_SURGERY_CENTER): Payer: Medicare Other | Admitting: *Deleted

## 2021-08-01 VITALS — Ht 69.0 in | Wt 150.0 lb

## 2021-08-01 DIAGNOSIS — Z1211 Encounter for screening for malignant neoplasm of colon: Secondary | ICD-10-CM

## 2021-08-01 MED ORDER — PEG 3350-KCL-NA BICARB-NACL 420 G PO SOLR: 4000.0000 mL | Freq: Once | ORAL | 0 refills | Status: AC

## 2021-08-01 NOTE — Progress Notes (Signed)
Patient's pre-visit was done today over the phone with the patient. Name,DOB and address verified. Patient denies any allergies to Eggs and Soy. Patient denies any problems with anesthesia/sedation. Patient is not taking any diet pills or blood thinners. No home Oxygen. Insurance confirmed with patient.  Prep instructions mailed to pt-pt is aware. Patient understands to call us back with any questions or concerns. Patient is aware of our care-partner policy.     

## 2021-08-10 ENCOUNTER — Encounter: Payer: Self-pay | Admitting: Gastroenterology

## 2021-08-16 ENCOUNTER — Telehealth: Payer: Self-pay | Admitting: Gastroenterology

## 2021-08-16 ENCOUNTER — Encounter: Payer: Medicare Other | Admitting: Gastroenterology

## 2021-08-16 NOTE — Telephone Encounter (Signed)
Good Morning Dr. Bryan Lemma,   I called this patient this morning to see if he was coming for his appointment he stated he was still on vacation.  He stated he would call back to reschedule his appointment.  I will NO SHOW this patient.-Medicare

## 2021-12-18 DEATH — deceased

## 2022-12-01 IMAGING — CR DG RIBS W/ CHEST 3+V*R*
5 series · 5 of 5 positions shown · non-contrast
Comparison: None.

CLINICAL DATA: Status post fall.

EXAM:
RIGHT RIBS AND CHEST - 3+ VIEW

[chest pa]
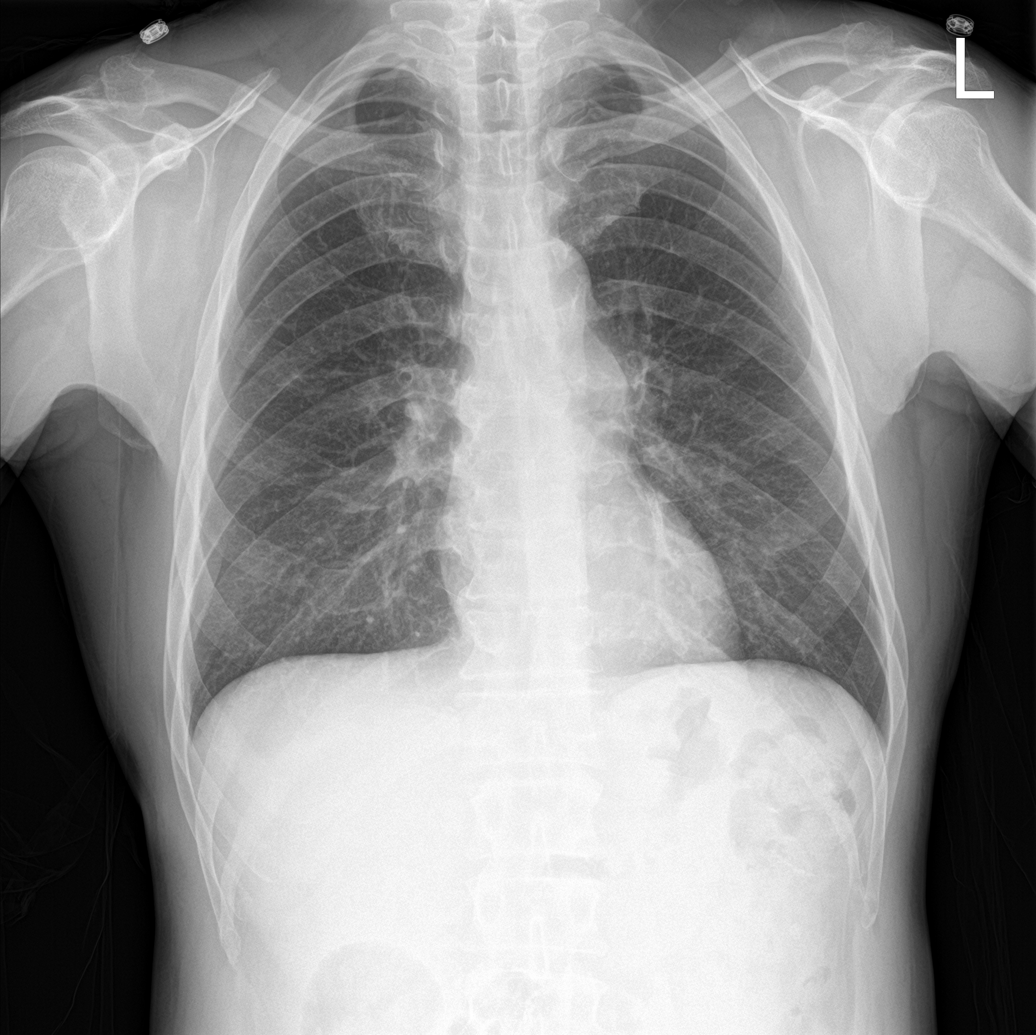

[rib pa (1 of 2)]
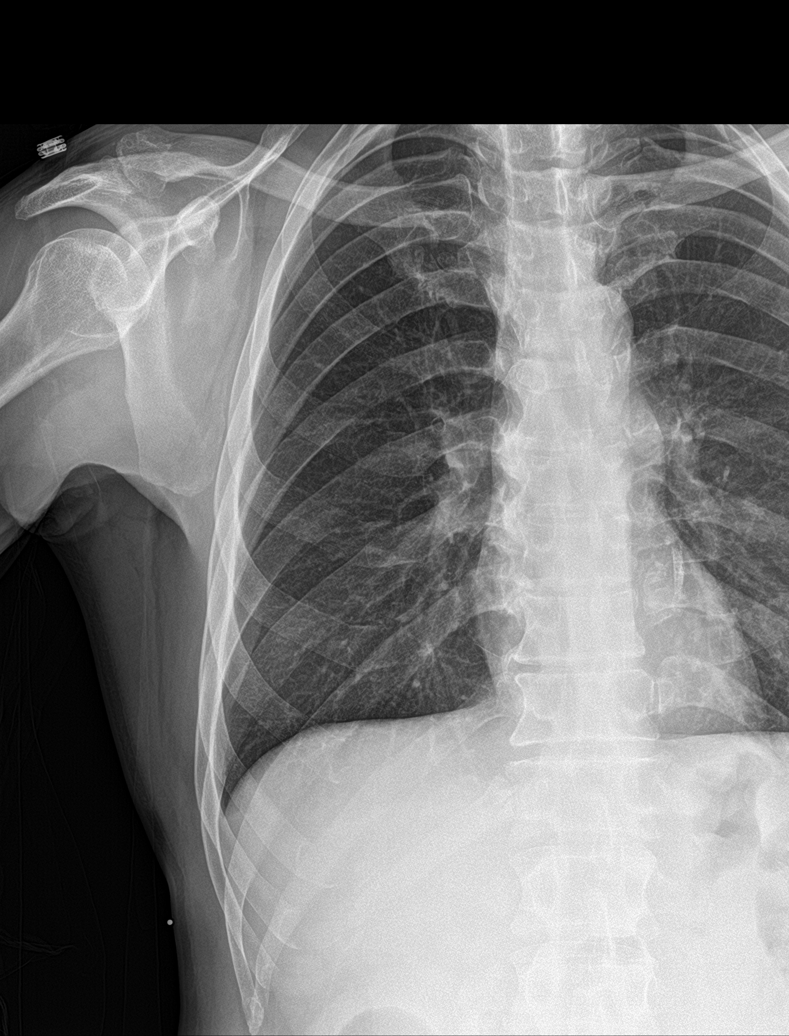

[rib pa obl (1 of 2)]
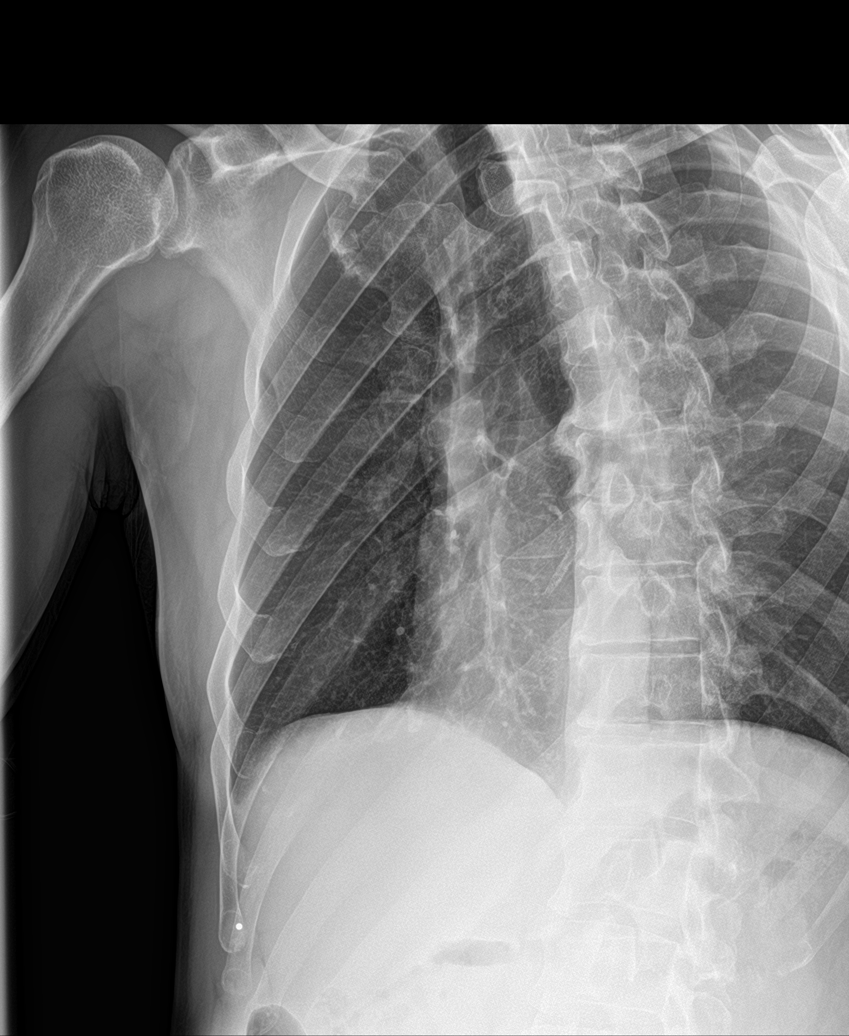

[rib pa (2 of 2)]
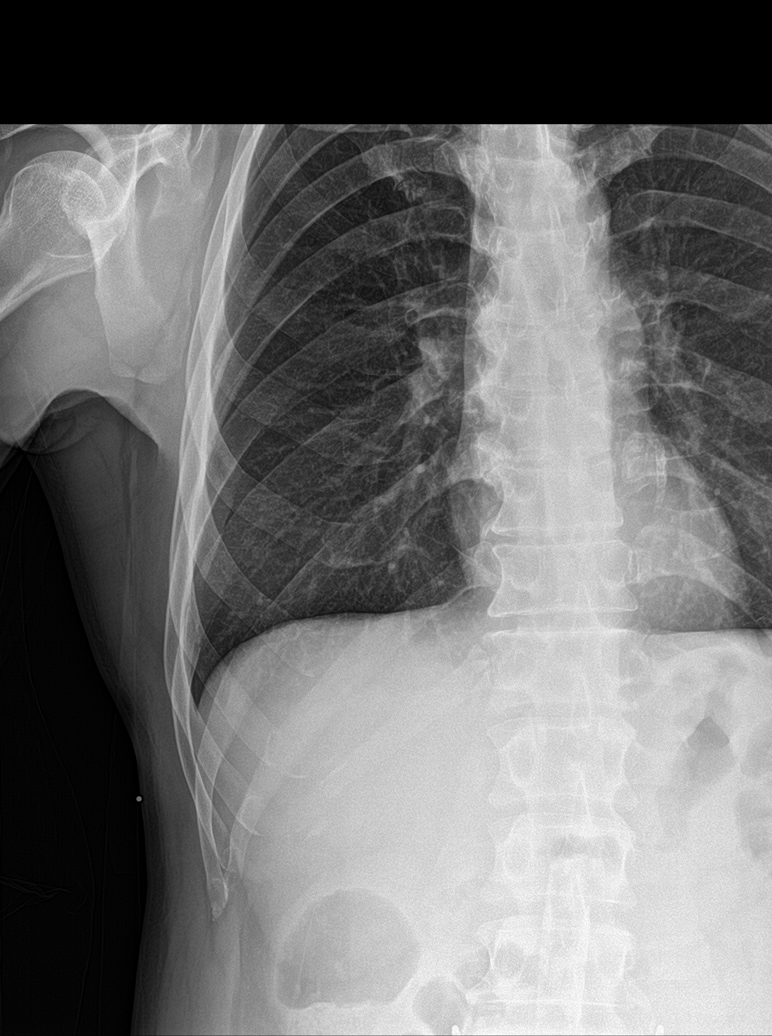

[rib pa obl (2 of 2)]
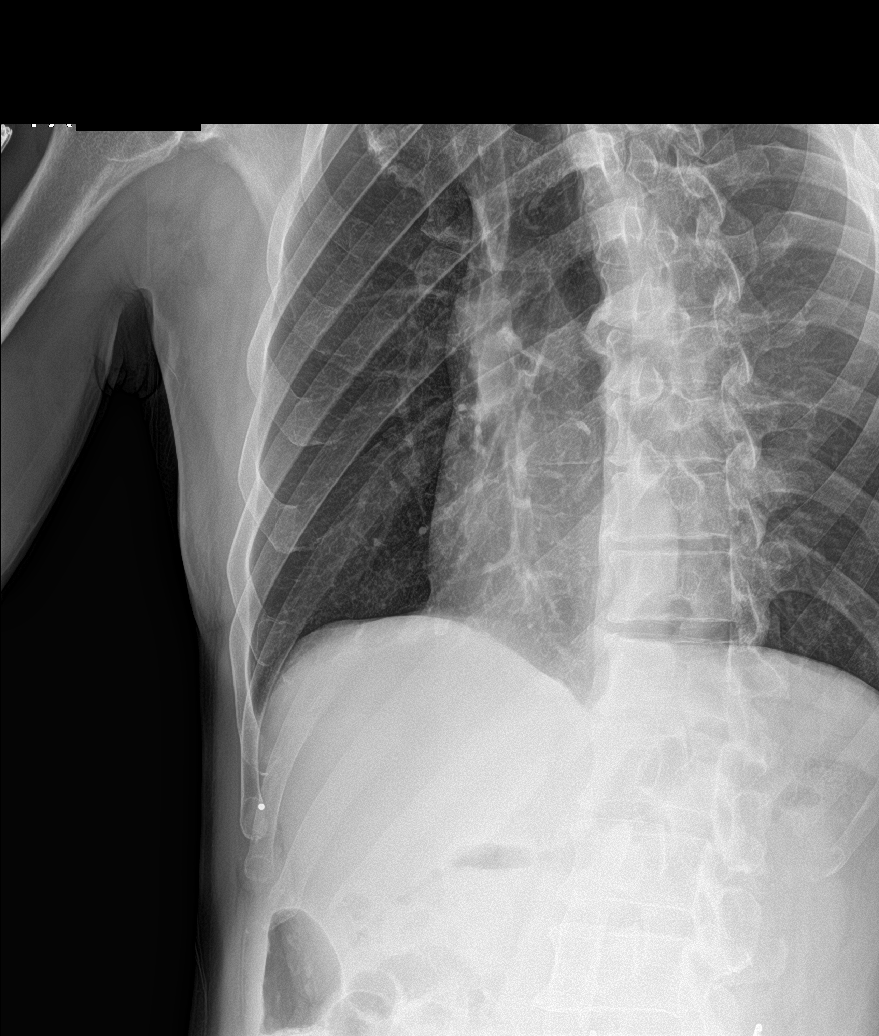

[5 of 5 positions shown; findings below may reference images not displayed]

FINDINGS: No fracture or other bone lesions are seen involving the ribs. There
is no evidence of pneumothorax or pleural effusion. Both lungs are
clear. Heart size and mediastinal contours are within normal limits.
IMPRESSION: Negative.
